# Patient Record
Sex: Female | Born: 1965 | Race: White | Hispanic: No | Marital: Married | Smoking: Never smoker
Health system: Southern US, Community
[De-identification: ages and names within clinical notes are randomized; demographics above are authoritative.]

## PROBLEM LIST (undated history)

## (undated) DIAGNOSIS — O903 Peripartum cardiomyopathy: Secondary | ICD-10-CM

## (undated) DIAGNOSIS — T7840XA Allergy, unspecified, initial encounter: Secondary | ICD-10-CM

## (undated) DIAGNOSIS — K8050 Calculus of bile duct without cholangitis or cholecystitis without obstruction: Secondary | ICD-10-CM

## (undated) DIAGNOSIS — L708 Other acne: Secondary | ICD-10-CM

## (undated) DIAGNOSIS — E162 Hypoglycemia, unspecified: Secondary | ICD-10-CM

## (undated) DIAGNOSIS — I38 Endocarditis, valve unspecified: Secondary | ICD-10-CM

## (undated) DIAGNOSIS — J40 Bronchitis, not specified as acute or chronic: Secondary | ICD-10-CM

## (undated) DIAGNOSIS — E119 Type 2 diabetes mellitus without complications: Secondary | ICD-10-CM

## (undated) DIAGNOSIS — K219 Gastro-esophageal reflux disease without esophagitis: Secondary | ICD-10-CM

## (undated) DIAGNOSIS — J45909 Unspecified asthma, uncomplicated: Secondary | ICD-10-CM

## (undated) DIAGNOSIS — R0789 Other chest pain: Secondary | ICD-10-CM

## (undated) DIAGNOSIS — Z86018 Personal history of other benign neoplasm: Secondary | ICD-10-CM

## (undated) HISTORY — DX: Allergy, unspecified, initial encounter: T78.40XA

## (undated) HISTORY — DX: Calculus of bile duct without cholangitis or cholecystitis without obstruction: K80.50

## (undated) HISTORY — PX: OTHER SURGICAL HISTORY: SHX169

## (undated) HISTORY — DX: Gastro-esophageal reflux disease without esophagitis: K21.9

## (undated) HISTORY — DX: Unspecified asthma, uncomplicated: J45.909

## (undated) HISTORY — DX: Other chest pain: R07.89

## (undated) HISTORY — DX: Hypoglycemia, unspecified: E16.2

## (undated) HISTORY — PX: WISDOM TOOTH EXTRACTION: SHX21

## (undated) HISTORY — DX: Other acne: L70.8

---
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
## 2002-12-04 ENCOUNTER — Inpatient Hospital Stay (HOSPITAL_COMMUNITY): Admission: AD | Admit: 2002-12-04 | Discharge: 2002-12-07 | Payer: Self-pay | Admitting: Obstetrics and Gynecology

## 2002-12-09 ENCOUNTER — Encounter: Payer: Self-pay | Admitting: Cardiovascular Disease

## 2002-12-09 ENCOUNTER — Inpatient Hospital Stay (HOSPITAL_COMMUNITY): Admission: AD | Admit: 2002-12-09 | Discharge: 2002-12-10 | Payer: Self-pay | Admitting: Obstetrics and Gynecology

## 2002-12-09 ENCOUNTER — Encounter: Payer: Self-pay | Admitting: Cardiology

## 2003-01-27 ENCOUNTER — Other Ambulatory Visit: Admission: RE | Admit: 2003-01-27 | Discharge: 2003-01-27 | Payer: Self-pay | Admitting: Obstetrics & Gynecology

## 2004-02-04 ENCOUNTER — Other Ambulatory Visit: Admission: RE | Admit: 2004-02-04 | Discharge: 2004-02-04 | Payer: Self-pay | Admitting: Obstetrics and Gynecology

## 2004-08-19 ENCOUNTER — Ambulatory Visit: Payer: Self-pay | Admitting: Family Medicine

## 2004-10-25 ENCOUNTER — Ambulatory Visit: Payer: Self-pay | Admitting: Family Medicine

## 2005-01-31 ENCOUNTER — Ambulatory Visit: Payer: Self-pay | Admitting: Family Medicine

## 2005-02-08 ENCOUNTER — Other Ambulatory Visit: Admission: RE | Admit: 2005-02-08 | Discharge: 2005-02-08 | Payer: Self-pay | Admitting: Obstetrics and Gynecology

## 2005-03-17 ENCOUNTER — Ambulatory Visit: Payer: Self-pay | Admitting: Family Medicine

## 2005-04-17 ENCOUNTER — Ambulatory Visit: Payer: Self-pay | Admitting: Family Medicine

## 2005-05-08 ENCOUNTER — Ambulatory Visit: Payer: Self-pay | Admitting: Internal Medicine

## 2005-10-23 ENCOUNTER — Ambulatory Visit: Payer: Self-pay | Admitting: Family Medicine

## 2005-10-25 ENCOUNTER — Ambulatory Visit: Payer: Self-pay | Admitting: Family Medicine

## 2005-12-23 HISTORY — PX: SPIROMETRY: SHX456

## 2005-12-26 ENCOUNTER — Ambulatory Visit: Payer: Self-pay | Admitting: Family Medicine

## 2005-12-27 ENCOUNTER — Ambulatory Visit: Payer: Self-pay | Admitting: Allergy and Immunology

## 2006-01-01 ENCOUNTER — Ambulatory Visit: Payer: Self-pay | Admitting: Family Medicine

## 2006-01-04 ENCOUNTER — Ambulatory Visit: Payer: Self-pay | Admitting: Family Medicine

## 2006-01-10 ENCOUNTER — Ambulatory Visit: Payer: Self-pay | Admitting: Family Medicine

## 2006-01-26 ENCOUNTER — Ambulatory Visit: Payer: Self-pay | Admitting: Family Medicine

## 2006-04-26 ENCOUNTER — Ambulatory Visit: Payer: Self-pay | Admitting: Family Medicine

## 2006-10-05 ENCOUNTER — Ambulatory Visit: Payer: Self-pay | Admitting: Family Medicine

## 2007-11-08 DIAGNOSIS — L708 Other acne: Secondary | ICD-10-CM

## 2007-11-08 DIAGNOSIS — J309 Allergic rhinitis, unspecified: Secondary | ICD-10-CM | POA: Insufficient documentation

## 2007-11-08 DIAGNOSIS — J45909 Unspecified asthma, uncomplicated: Secondary | ICD-10-CM | POA: Insufficient documentation

## 2007-11-08 DIAGNOSIS — E162 Hypoglycemia, unspecified: Secondary | ICD-10-CM

## 2007-11-08 DIAGNOSIS — K649 Unspecified hemorrhoids: Secondary | ICD-10-CM | POA: Insufficient documentation

## 2007-11-08 HISTORY — DX: Hypoglycemia, unspecified: E16.2

## 2007-11-08 HISTORY — DX: Other acne: L70.8

## 2007-11-11 ENCOUNTER — Ambulatory Visit: Payer: Self-pay | Admitting: Family Medicine

## 2007-11-11 DIAGNOSIS — L03019 Cellulitis of unspecified finger: Secondary | ICD-10-CM | POA: Insufficient documentation

## 2007-11-12 ENCOUNTER — Telehealth (INDEPENDENT_AMBULATORY_CARE_PROVIDER_SITE_OTHER): Payer: Self-pay | Admitting: *Deleted

## 2008-05-12 ENCOUNTER — Encounter: Payer: Self-pay | Admitting: Cardiovascular Disease

## 2008-05-12 ENCOUNTER — Emergency Department: Payer: Self-pay | Admitting: Emergency Medicine

## 2008-05-14 ENCOUNTER — Emergency Department: Payer: Self-pay | Admitting: Emergency Medicine

## 2008-09-08 ENCOUNTER — Encounter: Admission: RE | Admit: 2008-09-08 | Discharge: 2008-09-08 | Payer: Self-pay | Admitting: Obstetrics and Gynecology

## 2009-05-12 ENCOUNTER — Encounter: Payer: Self-pay | Admitting: Cardiovascular Disease

## 2009-09-08 ENCOUNTER — Telehealth (INDEPENDENT_AMBULATORY_CARE_PROVIDER_SITE_OTHER): Payer: Self-pay | Admitting: *Deleted

## 2009-09-14 ENCOUNTER — Ambulatory Visit: Payer: Self-pay | Admitting: Cardiovascular Disease

## 2009-09-14 DIAGNOSIS — R0789 Other chest pain: Secondary | ICD-10-CM

## 2009-09-14 DIAGNOSIS — E785 Hyperlipidemia, unspecified: Secondary | ICD-10-CM | POA: Insufficient documentation

## 2009-09-14 DIAGNOSIS — R0602 Shortness of breath: Secondary | ICD-10-CM | POA: Insufficient documentation

## 2009-09-14 HISTORY — DX: Other chest pain: R07.89

## 2010-02-22 NOTE — Assessment & Plan Note (Signed)
Summary: np6/.hx of cardiomyopathy   Visit Type:  Initial Consult Primary Lashina Milles:  Grace [name].  CC:  c/o having chest pressure & shortness of breath.  She tried to come off xanax and paxil but started to have symptoms of chest pressure and shortness of breath.   The visit today is more of a precautionary for her because after her first pregnancy she was told had cardiomyopathy..  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  She reports that she came off all of her medications shortly after diuresis in 2004 and has felt well since. She did have an episode of shortness of breath in April 2010 and went to the emergency room where she had a normal appearing CT scan of the chest, normal x-ray, normal labs. She was started on Paxil and Xanax and it was felt her symptoms were due to anxiety/panic.  She reports that she has gained significant weight over the past several years. She does not exercise very much as she travels in her car most of the day and eat junk food. She has tried to wean herself off Paxil and Xanax and she has felt a pressure in her chest and some shortness of breath. She is otherwise asymptomatic with exertion. She denies any significant lower extremity edema.  EKG shows normal sinus rhythm with a rate of 80 beats per minute, no significant ST or T wave changes.  She reports a total cholesterol of 200.  Some family history of coronary artery disease with her grandmother, father died early from lung cancer, no heart disease with her mother  Preventive Screening-Counseling & Management  Caffeine-Diet-Exercise     Does Patient Exercise: no   Drug Use:  no.    Current Medications (verified): 1)  Albuterol 90 Mcg/act Aers (Albuterol) .... As Needed 2)  Levlen (28) 0.15-30 Mg-Mcg  Tabs (Levonorgestrel-Ethinyl Estrad) .... Once Daily 3)  Nasonex 50 Mcg/act  Susp (Mometasone Furoate) .... Once Daily 4)  Asmanex 120 Metered Doses 220 Mcg/inh  Aepb (Mometasone Furoate) .... Two Times A Day 5)  Clarinex 5 Mg  Tabs (Desloratadine) .... As Needed 6)  Multivitamins   Tabs (Multiple Vitamin) .... Once Daily 7)  Calcium 500/d 500-400 Mg-Unit  Chew (Calcium-Vitamin D) .... Once Daily 8)  Fish Oil   Oil (Fish Oil) .... (Sometimes) 9)  Vitamin D 400 Unit  Tabs (Cholecalciferol) .... Once Daily 10)  Paxil 10 Mg Tabs (Paroxetine Hcl) .... One Tablet Once Daily 11)  Xanax 0.25 Mg Tabs (Alprazolam) .... 1/2 Three Times A Day  Allergies (verified): 1)  ! Penicillin 2)  ! * Antihistamines 3)  ! Keflex (Cephalexin)  Past History:  Past Medical History: Last updated: 11/08/2007 Allergic rhinitis Asthma  Past Surgical History: Last updated: 11/08/2007 Caesarean section HTN/ pulmonary edema with childbirth, had cardiac w/ up Spirometry ( at allergist's)- normal (12/2005)  Family History: Last updated: 11/08/2007 Father: chol, HTN Mother: chol, HTN Siblings:   Social History: Last updated: 09/14/2009 Marital Status: Married Children: 1 Occupation:  non smoker Alcohol Use - yes--occas. beer Drug Use - no Regular Exercise - no  Risk Factors: Smoking Status: never (11/08/2007)  Social History: Marital Status: Married Children: 1 Occupation:  non smoker  Alcohol Use - yes--occas. beer Drug Use - no Regular Exercise - no Drug Use:  no Does Patient Exercise:  no  Review of Systems       The patient complains of dyspnea on exertion.  The patient denies fever, weight loss, weight gain, vision loss, decreased hearing, hoarseness, chest pain, syncope, peripheral edema, prolonged cough, abdominal pain, incontinence, muscle  weakness, depression, and enlarged lymph nodes.         SOB and chest pressure at rest  Vital Signs:  Patient profile:   45 year old female Height:      61 inches Weight:      193 pounds BMI:     36.60 Pulse rate:   80 / minute BP sitting:   131 / 86  (left arm) Cuff size:   large  Vitals Entered By: Bishop Dublin, CMA (September 14, 2009 3:03 PM)  Physical Exam  General:  Well developed, well nourished, in no acute distress. Obese Head:  normocephalic and atraumatic Neck:  Neck supple, no JVD. No masses, thyromegaly or abnormal cervical nodes. Lungs:  Clear bilaterally to auscultation and percussion. Heart:  Non-displaced PMI, chest non-tender; regular rate and rhythm, S1, S2 without murmurs, rubs or gallops. Carotid upstroke normal, no bruit.  Pedals normal pulses. No edema, no varicosities. Abdomen:   abdomen soft and non-tender without masses Msk:  Back normal, normal gait. Muscle strength and tone normal. Pulses:  pulses normal in all 4 extremities Extremities:  No clubbing or cyanosis. Neurologic:  Alert and oriented x 3. Skin:  Intact without lesions or rashes. Psych:  Normal affect.   Impression & Recommendations:  Problem # 1:  OTHER CHEST PAIN (ICD-786.59) etiology of her chest pain is likely noncardiac. It comes on at rest and not associated with exertion. She has noticed it more as she has been weaning herself from Paxil and Xanax. She does eat a significant amount of food. Although she denies any significant lower extremity edema, and concerned about diastolic dysfunction given her size. We have given her a prescription for Lasix which she could take on a p.r.n. basis for shortness of breath. We also mentioned that possibly she could try low-dose beta blockers for her anxiety as a way to transition off her Paxil and Xanax. She would like to think about this.  Orders: EKG w/ Interpretation (93000)  Problem # 2:  HYPERLIPIDEMIA-MIXED (ICD-272.4) I have encouraged  her to increase her exercise, watch her diet in an effort to lose weight. This will help with her elevated cholesterol.  Problem # 3:  DYSPNEA (ICD-786.05) etiology of her dyspnea at rest is uncertain. We will try Lasix p.r.n. for significant symptoms. I asked her to cut back on her salt/sodium and junk food. We'll need to work on her weight, increase her exercise.  Appended Document: np6/.hx of cardiomyopathy echocardiogram from 2004 shows borderline to mildly reduced LV systolic function, mild to moderately dilated left atrium, mild mitral valve regurgitation, pleural effusion there was moderate size, mild tricuspid valve regurgitation, dilated IVC, mild.  Appended Document: np6/.hx of cardiomyopathy    Clinical Lists Changes  Medications: Added new medication of FUROSEMIDE 20 MG TABS (FUROSEMIDE) Take 1 tablet by mouth two times a day as needed for shortness of breath - Signed Rx of FUROSEMIDE 20 MG TABS (FUROSEMIDE) Take 1 tablet by mouth two times a day as needed for shortness of breath;  #45 x 2;  Signed;  Entered by: Benedict Needy, RN;  Authorized by: Dossie Arbour  MD;  Method used: Electronically to [id number] [address], [place], [place]  [address], Ph: [phone], Fax: [phone]    Prescriptions: FUROSEMIDE 20 MG TABS (FUROSEMIDE) Take 1 tablet by mouth two times a day as needed for shortness of breath  #45 x 2   Entered by:   Benedict Needy, RN   Authorized by:   Dossie Arbour MD   Signed by:   Benedict Needy, RN on 09/15/2009   Method used:   Electronically to        [id number] (retail)       [address]       [place], [place]  [address]       Ph: [phone]       Fax: [phone]   RxID:   [id number]

## 2010-02-22 NOTE — Letter (Signed)
Summary: PHI  PHI   Imported By: Harlon Flor 09/15/2009 16:39:48  _____________________________________________________________________  External Attachment:    Type:   Image     Comment:   External Document

## 2010-02-22 NOTE — Progress Notes (Signed)
  Phone Note Call from Patient   Caller: Pt Initial call taken by: KM    Pt called this a.m. asking for Records to be faxed/Recieved to/From [facility], Faxed her 2 ROI she completed faxed back to me,I sent over the Last 2 Echo's done here,also sent ROI over to Dr.Sam Morriyati's office @ [facility]  for all records to be sent over here to make a New Pt Appt. Grace [name]  September 08, 2009 8:55 AM  Appended Document:  Records recieved from [facility] gave to [place], Pt needs to make a NP appt

## 2010-06-10 NOTE — Discharge Summary (Signed)
NAME:  Grace [name], Grace [name]                        ACCOUNT NO.:  [id number]   MEDICAL RECORD NO.:  [id number]                   PATIENT TYPE:  INP   LOCATION:  9313                                 FACILITY:  [facility]   PHYSICIAN:  Freddy Finner, M.D.                DATE OF BIRTH:  Nov 24, 1965   DATE OF ADMISSION:  12/09/2002  DATE OF DISCHARGE:  12/10/2002                                 DISCHARGE SUMMARY   DISCHARGE DIAGNOSES:  Postpartum cardiomegaly and pulmonary edema secondary  to diastolic dysfunction, borderline hypertension, and fluid retention  secondary to pregnancy or alternatively postpartum cardiomyopathy. These  diagnoses addressed by Dr. Dietrich Pates with the [facility].   DISPOSITION:  The patient was in satisfactory improved condition at the time  of her discharge after a rather traumatic diuresis with IV Furosemide. She  was instructed to followup with Dr. Dietrich Pates in his [facility] office on  December 24, 2002 with another appointment on January 21, 2003.   DISCHARGE MEDICATIONS:  1. Lasix 20 mg a day.  2. KCL 10 meq a day.  3. Altace 10 mg a day.   HISTORY OF PRESENT ILLNESS:  Details of the present illness are recorded in  the admission note by Dr. Henderson Cloud and in the consultation note of Dr.  Dietrich Pates. Briefly, the patient presented on the day after her discharge from  cesarean delivery of December 05, 2002. Her physical findings on admission  did note O2 saturation of 99% on room air. Blood pressure 140/80. Chest was  remarkable for decreased breath sounds at the right base. She had 3+  bilateral pitting edema.   LABORATORY DATA:  On admission, she had a right middle lobe infiltrate,  cardiomegaly. A spiral CT showed congestive failure. No evidence of  pulmonary embolus.   Arterial blood gases on admission pH of 7.47, pO2 98.8.   HOSPITAL COURSE:  The patient was admitted by Dr. Harold Hedge and  subsequently was evaluated in consultation by Dr. Dietrich Pates. As  noted above,  the differential included cardiomegaly secondary to cardiomyopathy in the  postpartum period or diastolic dysfunction as noted above. Her hospital  course was remarkable for a rather traumatic immediate improvement with IV  Furosemide. Her response was so good that by the day after admission, her  condition was satisfactory for discharge and she was discharged with  disposition as noted above.                                               Freddy Finner, M.D.    WRN/MEDQ  D:  01/21/2003  T:  01/21/2003  Job:  [id number]   cc:   [place]omar Bing, M.D.

## 2010-06-10 NOTE — Discharge Summary (Signed)
NAME:  Grace [name], Grace [name]                          ACCOUNT NO.:  [id number]   MEDICAL RECORD NO.:  [id number]                   PATIENT TYPE:   LOCATION:                                       FACILITY:   PHYSICIAN:  Michelle L. Vincente Poli, M.D.            DATE OF BIRTH:   DATE OF ADMISSION:  12/04/2002  DATE OF DISCHARGE:  12/07/2002                                 DISCHARGE SUMMARY   ADMITTING DIAGNOSES:  1. Intrauterine pregnancy at 39 weeks estimated gestational age.  2. Mild pregnancy-induced hypertension.   DISCHARGE DIAGNOSES:  1. Status post low transverse cesarean section secondary to arrest of labor     at 8 cm.  2. Viable female infant.   PROCEDURE:  Primary low transverse cesarean section.   REASON FOR ADMISSION:  Please see written H&P.   HOSPITAL COURSE:  The patient was a 45 year old primigravida at 38-and-a-  half weeks estimated gestational age that was seen in the office the day of  admission with blood pressure noted to be 150/90 with a trace of  proteinuria.  The patient was also noted to have bilateral peripheral edema.  The patient was sent over to [facility] for [facility] labs and  nonstress test.  Decision was made for admission with artificial rupture of  membranes induction of labor.  Fetal heart tones were reactive, 142.  Cervix  was noted to be 3 cm dilated with vertex presentation.  Artificial rupture  of membranes was performed revealing clear fluid.  PIH labs were within  normal limits with liver function tests normal, uric acid 3.4.  On the  following morning fetal heart tones were reactive.  The patient was noted to  have uterine contractions every two minutes.  She did complain of some low  back pain.  The cervix was noted to be 7-8 cm dilated, completely effaced,  and vertex.  Intrauterine pressure catheter was placed for adequate  monitoring of labor.  Epidural was also given for the patient's comfort.  Pitocin was administered per  protocol.  Later that day the patient had noted  to be in good labor pattern; however, cervix was unchanged.  Decision was  made to proceed with a low transverse cesarean section.  The patient was  then transferred to the operating room where epidural was dosed to an  adequate surgical level.  A low transverse incision was made with the  delivery of a viable female infant weighing 8 pounds 6 ounces, Apgars of 8  at one minute and 9 at five minutes.  The patient did tolerate the procedure  well and was taken to the recovery room in stable condition.  On  postoperative day #1 vital signs were stable.  She had good return of bowel  function.  Fundus was firm and nontender.  Abdominal dressing was clean,  dry, and intact.  Labs revealed hemoglobin of 8.0; platelet count of  209,000; wbc count  of 13.3.  On postoperative day #2 the patient was doing  well; however, she did desire early discharge.  Vital signs were stable.  Abdomen was soft and nontender.  Incision was clean, dry, and intact.  Staples were removed and the patient was discharged home.   CONDITION ON DISCHARGE:  Good.   DIET:  Regular as tolerated..   ACTIVITY:  No heavy lifting, no driving x2 weeks, no vaginal entry.   FOLLOW-UP:  The patient is to follow up in the office in one week for an  incision check.  She is to call for temperature greater than 100 degrees,  persistent nausea or vomiting, heavy vaginal bleeding, and/or redness or  drainage from the incisional site.   DISCHARGE MEDICATIONS:  1. Tylox #30 one p.o. q.4-6h. p.r.n.  2. Ibuprofen 600 mg q.6h.  3. Prenatal vitamins one p.o. daily.  4. Colace one p.o. daily p.r.n.     Julio Sicks, N.P.                        Stann Mainland. Vincente Poli, M.D.    CC/MEDQ  D:  01/02/2003  T:  01/03/2003  Job:  [id number]

## 2010-06-10 NOTE — Consult Note (Signed)
NAME:  Grace [name], Grace [name]                        ACCOUNT NO.:  [id number]   MEDICAL RECORD NO.:  [id number]                   PATIENT TYPE:  INP   LOCATION:  9313                                 [facility]   PHYSICIAN:  [place]iddings Bing, M.D.               DATE OF BIRTH:  1965/11/25   DATE OF CONSULTATION:  12/09/2002  DATE OF DISCHARGE:                                   CONSULTATION   REFERRING PHYSICIAN:  Guy Sandifer. Henderson Cloud, M.D.   PRIMARY CARE PHYSICIAN:  None.   HISTORY OF PRESENT ILLNESS:  A 45 year old woman admitted to [facility] a few days after delivery by cesarean section with pulmonary edema.  Grace [name] has no prior cardiac history.  She has never previously been seen  by a cardiologist nor required any cardiac testing.  She has enjoyed normal  development and normal exercise tolerance even through the final weeks of  her pregnancy.  She did have some mild hypertension during pregnancy and  some mild proteinuria.  She delivered a healthy full-term infant four days  ago.  She developed after going home increase in exertional dyspnea, pedal  edema, and dizziness.  She returned to the hospital yesterday and was found  to have congestive heart failure by chest x-ray and by CT scan which ruled  out pulmonary embolism.  She had orthopnea last night, but is markedly  improved after a minimal dose of furosemide and a subsequent diuresis.  She  is not nursing her child.   PAST MEDICAL HISTORY:  Otherwise unremarkable.   ALLERGIES:  The patient reports an allergy to PENICILLIN.   MEDICATIONS:  Her only recent medication is Pepcid.   SOCIAL HISTORY:  Lives in [place].  This is her first pregnancy.  Has not  used tobacco products nor significant alcohol.  Employment is office work.   FAMILY HISTORY:  Parents are in their 17s and alive and well.   REVIEW OF SYSTEMS:  Some recent weight gain other than her pregnancy, a few  episodes of nocturnal diaphoresis, occasional  headaches and blurred vision.  All other systems negative.   PHYSICAL EXAMINATION:  GENERAL:  Mildly overweight, very pleasant, well-  appearing woman.  VITAL SIGNS:  Temperature 99.5, heart rate 70 and regular, respirations 16,  blood pressure 160/90.  HEENT:  Anicteric sclerae.  NECK:  Mild jugular venous distention.  No carotid bruits.  LUNGS:  Minimal bibasilar rales.  CARDIAC:  Normal first and second heart sounds.  Fourth heart sound had  modest early systolic murmur.  ABDOMEN:  Soft and nontender.  Well healing surgical incision.  EXTREMITIES:  Trace edema.  ENDOCRINE:  No thyromegaly.  HEMATOPOIETIC:  No adenopathy.   LABORATORIES:  EKG:  Normal sinus rhythm, anterior T-wave inversions.   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  An echocardiogram is pending.  We will also obtain a  TSH level which probably was done with her pregnancy and a BNP level.  She  will likely require treatment with ongoing diuretics, a beta blocker, and an  ACE inhibitor.  The prognosis and implications of her disease were discussed  with Grace [name].  She is appropriately concerned.  She was instructed in a  salt restricted diet.  If she continues to improve at this rate she could  likely be discharged in the morning for further outpatient cardiology follow-  up.                                               [place]lawn Bing, M.D.    RR/MEDQ  D:  12/09/2002  T:  12/09/2002  Job:  [id number]

## 2010-06-10 NOTE — Op Note (Signed)
NAME:  [name], Grace Kathie Rhodes                        ACCOUNT NO.:  [id number]   MEDICAL RECORD NO.:  [id number]                   PATIENT TYPE:  INP   LOCATION:  9163                                 [facility]   PHYSICIAN:  Michelle L. Vincente Poli, M.D.            DATE OF BIRTH:  27-Nov-1965   DATE OF PROCEDURE:  12/05/2002  DATE OF DISCHARGE:                                 OPERATIVE REPORT   PREOPERATIVE DIAGNOSES:  1. Intrauterine pregnancy at 38-1/2 weeks.  2. Mild pregnancy-induced hypertension.  3. Arrest of dilation at 8 cm.   POSTOPERATIVE DIAGNOSES:  1. Intrauterine pregnancy at 38-1/2 weeks.  2. Mild pregnancy-induced hypertension.  3. Arrest of dilation at 8 cm.   PROCEDURE:  Primary low transverse cesarean section.   SURGEON:  Michelle L. Vincente Poli, M.D.   ANESTHESIA:  Epidural.   ESTIMATED BLOOD LOSS:  500 mL.   FINDINGS:  A female infant in cephalic presentation in OP position.  Apgars  8 at one minute and 9 at five minutes, weighing 8 pounds 6 ounces.   DESCRIPTION OF PROCEDURE:  The patient was taken to the operating room, her  epidural was dosed.  She was then prepped and draped in the usual sterile  fashion.  A Foley catheter had already been placed in labor and delivery  while she was in labor.  A low transverse incision was made and carried down  to the fascia.  The fascia was scored in the midline.  It was noted she had  significant subcutaneous edema, probably due to the mild PIH that she has.  A Pfannenstiel incision was then developed.  The rectus muscles were  separated in the midline.  The peritoneum was entered sharply.  The  peritoneal incision was extended superiorly and then inferiorly with careful  attention to avoid the bladder.  The bladder blade was inserted and the  lower uterine segment was identified, and the bladder flap was created  sharply and then digitally, and the bladder blade was then readjusted.  A  low transverse incision was made in  the uterus and the uterus was entered  using a hemostat.  The amniotic fluid was noted to be clear.  The baby was  in OP position, was a female, and was delivered without any difficulty.  Apgars were 8 at one minute and 9 at five minutes, and the baby weighed 8  pounds 6 ounces.  The cord was clamped and cut and the baby was handed to  the waiting pediatricians.  The placenta was manually removed and noted to  be normal, and the uterus was exteriorized and cleared of all clots and  debris.  The uterine was closed in two layers using 0 chromic in continuous  running locked stitch.  The uterus was returned to the abdomen, reinspected,  and noted to be entirely hemostatic.  The peritoneum was closed using 0  Vicryl in a continuous running  stitch.  The rectus muscles were  reapproximated using the same 0 Vicryl.  The fascia was closed using 0  Vicryl in continuous running stitch, starting at each corner  and meeting in the midline.  After inspection of the subcutaneous layers,  the skin was closed with staples.  All sponge, lap, and instrument counts  were correct x2.  The patient tolerated the procedure well and went to the  recovery room in stable condition.                                               Michelle L. Vincente Poli, M.D.    Florestine Avers  D:  12/05/2002  T:  12/05/2002  Job:  [id number]

## 2010-08-17 ENCOUNTER — Encounter: Payer: Self-pay | Admitting: Cardiovascular Disease

## 2010-11-01 ENCOUNTER — Ambulatory Visit: Payer: Self-pay | Admitting: Cardiovascular Disease

## 2010-11-03 ENCOUNTER — Encounter: Payer: Self-pay | Admitting: Cardiovascular Disease

## 2010-11-07 ENCOUNTER — Ambulatory Visit: Payer: Self-pay | Admitting: Cardiovascular Disease

## 2010-11-17 ENCOUNTER — Encounter: Payer: Self-pay | Admitting: Cardiovascular Disease

## 2011-03-09 ENCOUNTER — Emergency Department: Payer: Self-pay | Admitting: Emergency Medicine

## 2011-03-09 LAB — CBC WITH DIFFERENTIAL/PLATELET
Basophil #: 0.1 10*3/uL (ref 0.0–0.1)
Basophil %: 1 %
Eosinophil #: 0.2 10*3/uL (ref 0.0–0.7)
Eosinophil %: 1.8 %
HCT: 35.7 % (ref 35.0–47.0)
HGB: 12.1 g/dL (ref 12.0–16.0)
Lymphocyte #: 3.7 10*3/uL — ABNORMAL HIGH (ref 1.0–3.6)
Lymphocyte %: 31.9 %
MCH: 29.8 pg (ref 26.0–34.0)
MCHC: 33.9 g/dL (ref 32.0–36.0)
MCV: 88 fL (ref 80–100)
Monocyte #: 0.7 10*3/uL (ref 0.0–0.7)
Monocyte %: 6 %
Neutrophil #: 6.9 10*3/uL — ABNORMAL HIGH (ref 1.4–6.5)
Neutrophil %: 59.3 %
Platelet: 276 10*3/uL (ref 150–440)
RBC: 4.06 10*6/uL (ref 3.80–5.20)
RDW: 16 % — ABNORMAL HIGH (ref 11.5–14.5)
WBC: 11.6 10*3/uL — ABNORMAL HIGH (ref 3.6–11.0)

## 2011-03-09 LAB — COMPREHENSIVE METABOLIC PANEL
Albumin: 3.6 g/dL (ref 3.4–5.0)
Alkaline Phosphatase: 92 U/L (ref 50–136)
Anion Gap: 11 (ref 7–16)
BUN: 10 mg/dL (ref 7–18)
Bilirubin,Total: 0.3 mg/dL (ref 0.2–1.0)
Calcium, Total: 8.3 mg/dL — ABNORMAL LOW (ref 8.5–10.1)
Chloride: 108 mmol/L — ABNORMAL HIGH (ref 98–107)
Co2: 22 mmol/L (ref 21–32)
Creatinine: 0.83 mg/dL (ref 0.60–1.30)
EGFR (African American): >60
EGFR (Non-African Amer.): >60
Glucose: 86 mg/dL (ref 65–99)
Osmolality: 280 (ref 275–301)
Potassium: 3.7 mmol/L (ref 3.5–5.1)
SGOT(AST): 17 U/L (ref 15–37)
SGPT (ALT): 18 U/L
Sodium: 141 mmol/L (ref 136–145)
Total Protein: 7.4 g/dL (ref 6.4–8.2)

## 2011-03-09 LAB — TSH: Thyroid Stimulating Horm: 2.2 u[IU]/mL

## 2011-03-09 LAB — TROPONIN I: Troponin-I: <0.02 ng/mL

## 2011-03-10 LAB — CK TOTAL AND CKMB (NOT AT ARMC)
CK, Total: 57 U/L (ref 21–215)
CK-MB: <0.5 ng/mL — ABNORMAL LOW (ref 0.5–3.6)

## 2011-03-10 LAB — PRO B NATRIURETIC PEPTIDE: B-Type Natriuretic Peptide: 47 pg/mL (ref 0–125)

## 2011-03-10 LAB — TROPONIN I: Troponin-I: <0.02 ng/mL

## 2011-03-17 ENCOUNTER — Ambulatory Visit: Payer: Self-pay | Admitting: Endocrinology

## 2011-03-30 ENCOUNTER — Ambulatory Visit: Payer: Self-pay | Admitting: Cardiovascular Disease

## 2011-12-25 ENCOUNTER — Institutional Professional Consult (permissible substitution): Payer: Self-pay | Admitting: Pulmonary Disease

## 2012-03-12 ENCOUNTER — Ambulatory Visit (INDEPENDENT_AMBULATORY_CARE_PROVIDER_SITE_OTHER): Payer: BC Managed Care – PPO | Admitting: Pulmonary Disease

## 2012-03-12 ENCOUNTER — Ambulatory Visit (INDEPENDENT_AMBULATORY_CARE_PROVIDER_SITE_OTHER)
Admission: RE | Admit: 2012-03-12 | Discharge: 2012-03-12 | Disposition: A | Discharge status: Home / Self Care | Payer: BC Managed Care – PPO | Source: Ambulatory Visit | Attending: Pulmonary Disease | Admitting: Pulmonary Disease

## 2012-03-12 ENCOUNTER — Encounter: Payer: Self-pay | Admitting: Pulmonary Disease

## 2012-03-12 VITALS — BP 108/66 | HR 73 | Ht 61.0 in | Wt 210.0 lb

## 2012-03-12 DIAGNOSIS — J45909 Unspecified asthma, uncomplicated: Secondary | ICD-10-CM

## 2012-03-12 DIAGNOSIS — R0602 Shortness of breath: Secondary | ICD-10-CM

## 2012-03-12 NOTE — Assessment & Plan Note (Signed)
I seriously question the diagnosis of asthma given the fact that Grace [name] does not have any environmental triggers for her shortness of breath. Because she receives some relief from albuterol and has a history of allergies is reasonable to consider this in the differential diagnosis. However, her symptoms are textbook descriptions of vocal cord dysfunction. She herself believes that anxiety is the primary cause of her shortness of breath and vocal cord dysfunction is strongly tied to this diagnosis.  In order to make this diagnosis she really needs to have a methacholine challenge test with concomitant laryngoscopy. Our best approach in the [place] area is to start with a methacholine challenge test. If that is negative then asthma will be ruled out and we can focus our treatment on that point on vocal cord dysfunction. If that test is equivocal then I will refer her to [facility] for a methacholine challenge test with concomitant laryngoscopy.  Plan: -Methacholine challenge -Continue Asmanex and albuterol for now -If methacholine challenge is negative then refer for behavioral therapy for vocal cord dysfunction (ideally we'll try to find somebody in [place] near where she works) -If methacholine challenge test is positive then she may need to have a methacholine challenge with concomitant laryngoscopy.

## 2012-03-12 NOTE — Progress Notes (Signed)
Subjective:    Patient ID: Grace [name], female    DOB: 09-Dec-1965, 47 y.o.   MRN: [id number]  HPI This is a very pleasant 47 year old female with anxiety and allergic rhinitis who comes to our clinic today for evaluation of possible asthma and shortness of breath. She states that as a child she had hayfever but no lung problems. During adulthood her allergies have been significant leading to frequent sinus infections. About once a year in the last 2 years she's had sinus and upper respiratory infections and been treated like bronchitis with antibiotics and Mucinex. She has been diagnosed with pneumonia twice in the last 5 years. 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 She was hospitalized briefly for this and her heart failure has resolved since. In 2010 she develop shortness of breath and she thought that this was a recurrence of her heart failure. She was evaluated in emergency room and was told it was anxiety. In the last 2 years she's had intermittent episodes of shortness of breath requiring at least 2 stress tests which have been negative and multiple EKGs. She tells me that she will get short of breath when she is at rest. She states that she will feel the sensation of a lump in her throat and that she cannot get enough air into her lungs. It is frequently associated with anxiety and chest tightness. Sometimes it is associated with heartburn. It has happened twice on Valentine's Day for the last 2 years. She was evaluated in the emergency room on Valentine's Day in 2013 and was told it was anxiety and discharged home. It occurred again on Valentine's Day 2014 and she managed to stay at home and the symptoms subsided on her own. She also had shortness of breath when her daughter started school. She stated that the shortness of breath increases to the point to where she is gasping for air and frankly  feels awful. After rest she will calm down and the shortness of breath goes away.  These episodes do not occur in response to any environmental triggers. She has 2 cats and a dog in the house and states that being around them do not exacerbate the symptoms. Smoker, dust, pollen, fumes do not cause the symptoms. Indigestion and reflux will make the episodes worse. Albuterol sometimes helps her with this. She has been on Asmanex for the last several months and she says that this does not help. This was changed to ciclesonide at some point by an allergist who diagnosed her with vocal cord dysfunction stating that the small particles would likely be more effectively delivered into her lungs given her vocal cord dysfunction. She feels that the shortness of breath is do to anxiety and states that Paxil and Xanax have helped significantly with both the frequency and severity of these episodes. She has also been evaluated by Dr. Bud Face with ear nose and throat who thinks that she may have some component of reflux which is exacerbating her symptoms. She has multiple allergies to grases, pollens, tress, dust, mold, cats, and dogs. She has been unable to tolerate Zyrtec, Claritin, and Singulair for various reasons including itching and "breaking out". She was on allergy shots in 2012 for 6 months, had to quit do to her work schedule as she works in [place] and lives near [place].    Past Medical History  Diagnosis Date  . Allergic  rhinitis   . Asthma      Family History  Problem Relation Age of Onset  . Hyperlipidemia Mother   . Hypertension Mother   . Hypertension Father   . Hyperlipidemia Father      History   Social History  . Marital Status: Married    Spouse Name: N/A    Number of Children: N/A  . Years of Education: N/A   Occupational History  . Not on file.   Social History Main Topics  . Smoking status: Never Smoker   . Smokeless tobacco: Not on file  . Alcohol  Use: Yes     Comment: occas  . Drug Use: No  . Sexually Active:    Other Topics Concern  . Not on file   Social History Narrative  . No narrative on file     Allergies  Allergen Reactions  . Cephalexin     REACTION: (hives)  . Penicillins     REACTION: (child)  . Zyrtec (Cetirizine)      Outpatient Prescriptions Prior to Visit  Medication Sig Dispense Refill  . albuterol (PROVENTIL,VENTOLIN) 90 MCG/ACT inhaler Inhale into the lungs as needed.        . mometasone (NASONEX) 50 MCG/ACT nasal spray Place 2 sprays into the nose daily.        . Multiple Vitamins-Minerals (MULTIVITAL) tablet Take 1 tablet by mouth daily.        Marland Kitchen PARoxetine (PAXIL) 10 MG tablet Take 10 mg by mouth daily.        . vitamin D, CHOLECALCIFEROL, 400 UNITS tablet Take 400 Units by mouth daily.        . mometasone (ASMANEX 120 METERED DOSES) 220 MCG/INH inhaler Inhale 2 puffs into the lungs 2 (two) times daily.        Marland Kitchen ALPRAZolam (XANAX) 0.25 MG tablet Take 0.25 mg by mouth 3 (three) times daily. Take 1/2 tab       . Calcium Carb-Cholecalciferol (CALCIUM 500 +D) 500-400 MG-UNIT TABS Take by mouth daily.        Marland Kitchen desloratadine (CLARINEX) 5 MG tablet Take 5 mg by mouth daily.        . furosemide (LASIX) 20 MG tablet Take 20 mg by mouth 2 (two) times daily. As needed for shortness of breath       . levonorgestrel-ethinyl estradiol (NORDETTE) 0.15-30 MG-MCG per tablet Take 1 tablet by mouth daily.        . Omega-3 Fatty Acids (FISH OIL) 1000 MG CAPS Take by mouth. sometimes        No facility-administered medications prior to visit.      Review of Systems  Constitutional: Negative for fever and unexpected weight change.  HENT: Negative for ear pain, nosebleeds, congestion, sore throat, rhinorrhea, sneezing, trouble swallowing, dental problem, postnasal drip and sinus pressure.   Eyes: Negative for redness and itching.  Respiratory: Positive for chest tightness and shortness of breath. Negative for cough  and wheezing.   Cardiovascular: Negative for palpitations and leg swelling.  Gastrointestinal: Negative for nausea and vomiting.  Genitourinary: Negative for dysuria.  Musculoskeletal: Negative for joint swelling.  Skin: Negative for rash.  Neurological: Negative for headaches.  Hematological: Does not bruise/bleed easily.  Psychiatric/Behavioral: Negative for dysphoric mood. The patient is not nervous/anxious.        Objective:   Physical Exam  Filed Vitals:   03/12/12 1506  BP: 108/66  Pulse: 73  Height: 5\' 1"  (1.549 m)  Weight: 95.255 kg (  210 lb)  SpO2: 99%  Room Air  Gen: well appearing, no acute distress HEENT: NCAT, PERRL, EOMi, OP clear, neck supple without masses PULM: CTA B CV: RRR, no mgr, no JVD AB: BS+, soft, nontender, no hsm Ext: warm, no edema, no clubbing, no cyanosis Derm: no rash or skin breakdown Neuro: A&Ox4, CN II-XII intact, strength 5/5 in all 4 extremities      Assessment & Plan:   ASTHMA I seriously question the diagnosis of asthma given the fact that Ms. Knope does not have any environmental triggers for her shortness of breath. Because she receives some relief from albuterol and has a history of allergies is reasonable to consider this in the differential diagnosis. However, her symptoms are textbook descriptions of vocal cord dysfunction. She herself believes that anxiety is the primary cause of her shortness of breath and vocal cord dysfunction is strongly tied to this diagnosis.  In order to make this diagnosis she really needs to have a methacholine challenge test with concomitant laryngoscopy. Our best approach in the [place] area is to start with a methacholine challenge test. If that is negative then asthma will be ruled out and we can focus our treatment on that point on vocal cord dysfunction. If that test is equivocal then I will refer her to [facility] for a methacholine challenge test with concomitant laryngoscopy.  Plan: -Methacholine  challenge -Continue Asmanex and albuterol for now -If methacholine challenge is negative then refer for behavioral therapy for vocal cord dysfunction (ideally we'll try to find somebody in [place] near where she works) -If methacholine challenge test is positive then she may need to have a methacholine challenge with concomitant laryngoscopy.   Updated Medication List Outpatient Encounter Prescriptions as of 03/12/2012  Medication Sig Dispense Refill  . albuterol (PROVENTIL,VENTOLIN) 90 MCG/ACT inhaler Inhale into the lungs as needed.        . ciclesonide (ALVESCO) 160 MCG/ACT inhaler Inhale 1 puff into the lungs 2 (two) times daily.      Marland Kitchen dexlansoprazole (DEXILANT) 60 MG capsule Take 60 mg by mouth daily.      . mometasone (NASONEX) 50 MCG/ACT nasal spray Place 2 sprays into the nose daily.        . Multiple Vitamins-Minerals (MULTIVITAL) tablet Take 1 tablet by mouth daily.        Marland Kitchen PARoxetine (PAXIL) 10 MG tablet Take 10 mg by mouth daily.        . vitamin D, CHOLECALCIFEROL, 400 UNITS tablet Take 400 Units by mouth daily.        . Vitamin D, Ergocalciferol, (DRISDOL) 50000 UNITS CAPS Take 50,000 Units by mouth every 7 (seven) days.      . [DISCONTINUED] mometasone (ASMANEX 120 METERED DOSES) 220 MCG/INH inhaler Inhale 2 puffs into the lungs 2 (two) times daily.        . [DISCONTINUED] ALPRAZolam (XANAX) 0.25 MG tablet Take 0.25 mg by mouth 3 (three) times daily. Take 1/2 tab       . [DISCONTINUED] Calcium Carb-Cholecalciferol (CALCIUM 500 +D) 500-400 MG-UNIT TABS Take by mouth daily.        . [DISCONTINUED] desloratadine (CLARINEX) 5 MG tablet Take 5 mg by mouth daily.        . [DISCONTINUED] furosemide (LASIX) 20 MG tablet Take 20 mg by mouth 2 (two) times daily. As needed for shortness of breath       . [DISCONTINUED] levonorgestrel-ethinyl estradiol (NORDETTE) 0.15-30 MG-MCG per tablet Take 1 tablet by mouth daily.        . [  DISCONTINUED] Omega-3 Fatty Acids (FISH OIL) 1000 MG  CAPS Take by mouth. sometimes        No facility-administered encounter medications on file as of 03/12/2012.

## 2012-03-12 NOTE — Patient Instructions (Addendum)
We will send you for a chest x-ray at the [facility] office for shortness of breath  We will send you for a metacholine challenge test at [facility] Keep taking your medications as written We will schedule a 6-8 week follow up visit and will try to get all testing needed done before this.  If not all testing is done prior to that visit then we may change the date.

## 2012-03-15 ENCOUNTER — Telehealth: Payer: Self-pay | Admitting: Pulmonary Disease

## 2012-03-15 NOTE — Telephone Encounter (Signed)
Notes Recorded by Lupita Leash, MD on 03/13/2012 at 9:07 PM L,  Please let her know that her CXR was OK  B --  ATC pt she had bad cell reception and then call was lost. Will await pt call back

## 2012-03-18 NOTE — Telephone Encounter (Signed)
LMTCBx2 on all numbers in pt chart. Carron Curie, CMA

## 2012-03-18 NOTE — Telephone Encounter (Signed)
Pt advised of results. Cheyeanne Roadcap, CMA  

## 2012-03-19 ENCOUNTER — Ambulatory Visit: Payer: Self-pay | Admitting: Pulmonary Disease

## 2012-03-19 LAB — PULMONARY FUNCTION TEST

## 2012-03-27 ENCOUNTER — Encounter: Payer: Self-pay | Admitting: Pulmonary Disease

## 2012-03-28 ENCOUNTER — Telehealth: Payer: Self-pay | Admitting: *Deleted

## 2012-03-28 NOTE — Telephone Encounter (Signed)
Spoke to [place] in cardio-pulmonary @[facility]  she said the methacholine was not scheduled however it was on the order anyway do you want me to schedule it now sorry libby

## 2012-03-28 NOTE — Telephone Encounter (Signed)
Please scheduled the metacholine challenge

## 2012-03-28 NOTE — Telephone Encounter (Signed)
Spoke with [facility] and will forward to them to look into this, thanks

## 2012-03-28 NOTE — Telephone Encounter (Signed)
LMTCB for pt 

## 2012-03-28 NOTE — Telephone Encounter (Signed)
Message copied by Christen Butter on Thu Mar 28, 2012  9:26 AM ------      Message from: Max Fickle B      Created: Wed Mar 27, 2012  7:58 PM       L,            I sent this lady to [facility] for a PFT with metacholine challenge.  All they sent me was a full PFT (which was normal).  Can you try to find out if they did a metacholine challenge and if they did to send me the results.              Also, please let her know that the study was normal.            Thanks,      Kipp Brood ------

## 2012-03-29 NOTE — Telephone Encounter (Signed)
Methacholine callenge test scheduled at [facility] 04/04/12@9 :30am lmtcb with pt Grace [name]

## 2012-04-01 NOTE — Telephone Encounter (Signed)
Methacholine will be rescheduled by pt she can not go this date she was given the # to call to reschedule Grace [name]

## 2012-04-03 ENCOUNTER — Encounter: Payer: Self-pay | Admitting: Pulmonary Disease

## 2012-04-09 ENCOUNTER — Ambulatory Visit: Payer: Self-pay | Admitting: Pulmonary Disease

## 2012-04-09 LAB — PULMONARY FUNCTION TEST

## 2012-04-15 ENCOUNTER — Telehealth: Payer: Self-pay | Admitting: Pulmonary Disease

## 2012-04-15 ENCOUNTER — Encounter: Payer: Self-pay | Admitting: Pulmonary Disease

## 2012-04-15 DIAGNOSIS — R0602 Shortness of breath: Secondary | ICD-10-CM

## 2012-04-15 NOTE — Telephone Encounter (Signed)
I called Ms. Lamarche to let her know that her methacholine challenge testing was negative.  SHE DOES NOT HAVE ASTHMA.    Unfortunately I had to leave a message on both her numbers.  cc'ing PCP Dr. Maxie Better, [facility] Pager: [phone] Cell: [phone] If no response, call [phone]

## 2012-04-15 NOTE — Telephone Encounter (Signed)
Spoke with patient, patient aware of recs per Dr. Kendrick Fries as listed below. Patient plans to keep appt on 05/07/12. Nothing further at this time.

## 2012-04-15 NOTE — Telephone Encounter (Signed)
1) She can stop the Alvesco 2) I don't think she needs the laryngoscopy since the likelihood of vocal cord dysfunction is very high 3) It would be worthwhile for her to keep her April appointment with me to make sure she is OK off the albuterol

## 2012-04-15 NOTE — Telephone Encounter (Signed)
Spoke with patient, states she received VM for BQ. Patient has three questions: 1. Since methacholine challenge negative should she continue on the Alvesco 160 - 1 puff BID? 2.Does he still plan on having other procedure done since test was negative, and if so should it also be done prior to next OV 3. Next OV is on 05/07/12 should she keep this appt or have it rescheduled.  Dr. Kendrick Fries please advise, thank you    OV from 03/12/12: Plan:  -Methacholine challenge  -Continue Asmanex and albuterol for now  -If methacholine challenge is negative then refer for behavioral therapy for vocal cord dysfunction (ideally we'll try to find somebody in [place] near where she works)  -If methacholine challenge test is positive then she may need to have a methacholine challenge with concomitant laryngoscopy.

## 2012-04-16 ENCOUNTER — Encounter: Payer: Self-pay | Admitting: Pulmonary Disease

## 2012-04-29 ENCOUNTER — Encounter: Payer: Self-pay | Admitting: Pulmonary Disease

## 2012-04-30 ENCOUNTER — Ambulatory Visit: Payer: BC Managed Care – PPO | Admitting: Pulmonary Disease

## 2012-05-07 ENCOUNTER — Encounter: Payer: Self-pay | Admitting: Pulmonary Disease

## 2012-05-07 ENCOUNTER — Ambulatory Visit (INDEPENDENT_AMBULATORY_CARE_PROVIDER_SITE_OTHER): Payer: BC Managed Care – PPO | Admitting: Pulmonary Disease

## 2012-05-07 VITALS — BP 106/70 | HR 93 | Temp 98.1°F | Ht 61.0 in | Wt 211.0 lb

## 2012-05-07 DIAGNOSIS — R0602 Shortness of breath: Secondary | ICD-10-CM

## 2012-05-07 DIAGNOSIS — K219 Gastro-esophageal reflux disease without esophagitis: Secondary | ICD-10-CM

## 2012-05-07 DIAGNOSIS — J383 Other diseases of vocal cords: Secondary | ICD-10-CM

## 2012-05-07 HISTORY — DX: Gastro-esophageal reflux disease without esophagitis: K21.9

## 2012-05-07 NOTE — Patient Instructions (Signed)
Follow the lifestyle modifications for Gastric Reflux we provided you We will make a referral to the [facility] for Voice and Swallowing disorders for evaluation and management of your vocal cord dysfunction.  We will see you back as needed

## 2012-05-07 NOTE — Assessment & Plan Note (Signed)
Grace [name]'s methacholine challenge test was negative. Based on this she does not have asthma. I believe she has vocal cord dysfunction based on the sensation of her throat closing up associated with anxiety and reflux. Reflux is likely making this worse.  Plan: -Refer to the voice and swallowing disorders clinic at [facility] (she works in [place] and would prefer to be seen there.) -Agree with gastroesophageal reflux therapy and GI evaluation -No indication for inhaled steroid -Followup with [place] when necessary

## 2012-05-07 NOTE — Assessment & Plan Note (Signed)
I believe this is making her vocal cord dysfunction worse.  Plan: -We reviewed lifestyle modification for gastroesophageal reflux disease -Continue PPI at dose -Agree with GI evaluation

## 2012-05-07 NOTE — Progress Notes (Signed)
Subjective:    Patient ID: Grace [name], female    DOB: October 21, 1965, 47 y.o.   MRN: [id number]  Synopsis: Grace [name] has allergic rhinitis and had been labeled as asthma prior to her first visit with [facility] in February 2014.  On that visit she stated that she had worried that she had vocal cord dysfunction.  She was then sent for a methacholine challenge test that was negative.    HPI  05/07/12 ROV -- Grace [name] stopped using her ciclesonide inhaler at my instruction and states that she has not had worsening symptoms.  However, she continues to have the sensation that her upper airway is closing up when she is stressed and when she has a lot of heartburn.  She states that her allergies have been fairly well controlled lately.  She uses her Dexilant pill daily.    Past Medical History  Diagnosis Date  . Allergic rhinitis   . Asthma      Review of Systems  Constitutional: Negative for fever, chills and fatigue.  HENT: Negative for nosebleeds, congestion and rhinorrhea.   Respiratory: Positive for shortness of breath. Negative for choking, chest tightness and wheezing.   Cardiovascular: Negative for chest pain, palpitations and leg swelling.       Objective:   Physical Exam  Filed Vitals:   05/07/12 1630  BP: 106/70  Pulse: 93  Temp: 98.1 F (36.7 C)  TempSrc: Oral  Height: 5\' 1"  (1.549 m)  Weight: 211 lb (95.709 kg)  SpO2: 98%   Gen: well appearing, no acute distress HEENT: NCAT, EOMi, OP clear PULM: CTA B, normal percussion CV: RRR, no mgr, no JVD AB: BS+, soft, nontender, no hsm Ext: warm, no edema, no clubbing, no cyanosis      Assessment & Plan:   DYSPNEA Grace [name]'s methacholine challenge test was negative. Based on this she does not have asthma. I believe she has vocal cord dysfunction based on the sensation of her throat closing up associated with anxiety and reflux. Reflux is likely making this worse.  Plan: -Refer to the voice and  swallowing disorders clinic at [facility] (she works in [place] and would prefer to be seen there.) -Agree with gastroesophageal reflux therapy and GI evaluation -No indication for inhaled steroid -Followup with [place] when necessary  GERD (gastroesophageal reflux disease) I believe this is making her vocal cord dysfunction worse.  Plan: -We reviewed lifestyle modification for gastroesophageal reflux disease -Continue PPI at dose -Agree with GI evaluation   Updated Medication List Outpatient Encounter Prescriptions as of 05/07/2012  Medication Sig Dispense Refill  . albuterol (PROVENTIL,VENTOLIN) 90 MCG/ACT inhaler Inhale into the lungs as needed.        Marland Kitchen dexlansoprazole (DEXILANT) 60 MG capsule Take 60 mg by mouth daily.      . mometasone (NASONEX) 50 MCG/ACT nasal spray Place 2 sprays into the nose daily.        . Multiple Vitamins-Minerals (MULTIVITAL) tablet Take 1 tablet by mouth daily.        Marland Kitchen PARoxetine (PAXIL) 10 MG tablet Take 10 mg by mouth daily.        . Vitamin D, Ergocalciferol, (DRISDOL) 50000 UNITS CAPS Take 50,000 Units by mouth every 7 (seven) days.      . [DISCONTINUED] ciclesonide (ALVESCO) 160 MCG/ACT inhaler Inhale 1 puff into the lungs 2 (two) times daily.      . [DISCONTINUED] vitamin D, CHOLECALCIFEROL, 400 UNITS tablet Take 400 Units by mouth daily.  No facility-administered encounter medications on file as of 05/07/2012.

## 2013-10-16 ENCOUNTER — Other Ambulatory Visit: Payer: Self-pay | Admitting: Obstetrics and Gynecology

## 2013-10-16 DIAGNOSIS — R928 Other abnormal and inconclusive findings on diagnostic imaging of breast: Secondary | ICD-10-CM

## 2013-10-24 ENCOUNTER — Ambulatory Visit
Admission: RE | Admit: 2013-10-24 | Discharge: 2013-10-24 | Disposition: A | Discharge status: Home / Self Care | Payer: BC Managed Care – PPO | Source: Ambulatory Visit | Attending: Obstetrics and Gynecology | Admitting: Obstetrics and Gynecology

## 2013-10-24 DIAGNOSIS — R928 Other abnormal and inconclusive findings on diagnostic imaging of breast: Secondary | ICD-10-CM

## 2013-12-09 DIAGNOSIS — L501 Idiopathic urticaria: Secondary | ICD-10-CM | POA: Insufficient documentation

## 2013-12-09 DIAGNOSIS — R21 Rash and other nonspecific skin eruption: Secondary | ICD-10-CM | POA: Insufficient documentation

## 2013-12-09 DIAGNOSIS — T781XXA Other adverse food reactions, not elsewhere classified, initial encounter: Secondary | ICD-10-CM | POA: Insufficient documentation

## 2017-01-23 DIAGNOSIS — Z86018 Personal history of other benign neoplasm: Secondary | ICD-10-CM

## 2017-01-23 HISTORY — DX: Personal history of other benign neoplasm: Z86.018

## 2017-02-07 ENCOUNTER — Other Ambulatory Visit: Payer: Self-pay | Admitting: Obstetrics and Gynecology

## 2017-02-07 DIAGNOSIS — R928 Other abnormal and inconclusive findings on diagnostic imaging of breast: Secondary | ICD-10-CM

## 2017-02-13 ENCOUNTER — Ambulatory Visit
Admission: RE | Admit: 2017-02-13 | Discharge: 2017-02-13 | Disposition: A | Discharge status: Home / Self Care | Payer: BLUE CROSS/BLUE SHIELD | Source: Ambulatory Visit | Attending: Obstetrics and Gynecology | Admitting: Obstetrics and Gynecology

## 2017-02-13 ENCOUNTER — Ambulatory Visit: Payer: Self-pay

## 2017-02-13 DIAGNOSIS — R928 Other abnormal and inconclusive findings on diagnostic imaging of breast: Secondary | ICD-10-CM

## 2017-02-26 ENCOUNTER — Emergency Department: Payer: BLUE CROSS/BLUE SHIELD

## 2017-02-26 ENCOUNTER — Other Ambulatory Visit: Payer: Self-pay

## 2017-02-26 ENCOUNTER — Emergency Department
Admission: EM | Admit: 2017-02-26 | Discharge: 2017-02-26 | Disposition: A | Discharge status: Home / Self Care | Payer: BLUE CROSS/BLUE SHIELD | Attending: Emergency Medicine | Admitting: Emergency Medicine

## 2017-02-26 ENCOUNTER — Encounter: Payer: Self-pay | Admitting: *Deleted

## 2017-02-26 DIAGNOSIS — R062 Wheezing: Secondary | ICD-10-CM

## 2017-02-26 DIAGNOSIS — R05 Cough: Secondary | ICD-10-CM

## 2017-02-26 DIAGNOSIS — Z79899 Other long term (current) drug therapy: Secondary | ICD-10-CM | POA: Diagnosis not present

## 2017-02-26 DIAGNOSIS — Y999 Unspecified external cause status: Secondary | ICD-10-CM | POA: Diagnosis not present

## 2017-02-26 DIAGNOSIS — J45909 Unspecified asthma, uncomplicated: Secondary | ICD-10-CM | POA: Diagnosis not present

## 2017-02-26 DIAGNOSIS — X58XXXA Exposure to other specified factors, initial encounter: Secondary | ICD-10-CM | POA: Insufficient documentation

## 2017-02-26 DIAGNOSIS — Y939 Activity, unspecified: Secondary | ICD-10-CM | POA: Insufficient documentation

## 2017-02-26 DIAGNOSIS — R059 Cough, unspecified: Secondary | ICD-10-CM

## 2017-02-26 DIAGNOSIS — T17908A Unspecified foreign body in respiratory tract, part unspecified causing other injury, initial encounter: Secondary | ICD-10-CM | POA: Insufficient documentation

## 2017-02-26 DIAGNOSIS — Y929 Unspecified place or not applicable: Secondary | ICD-10-CM | POA: Insufficient documentation

## 2017-02-26 MED ORDER — PREDNISONE 20 MG PO TABS: 40.0000 mg | ORAL_TABLET | Freq: Every day | ORAL | 0 refills | Status: AC

## 2017-02-26 MED ORDER — AZITHROMYCIN 500 MG PO TABS
500.0000 mg | ORAL_TABLET | Freq: Once | ORAL | Status: AC
  Administered 2017-02-26: 500 mg via ORAL
  Filled 2017-02-26: qty 1

## 2017-02-26 MED ORDER — IPRATROPIUM-ALBUTEROL 0.5-2.5 (3) MG/3ML IN SOLN
3.0000 mL | Freq: Once | RESPIRATORY_TRACT | Status: AC
  Administered 2017-02-26: 3 mL via RESPIRATORY_TRACT
  Filled 2017-02-26: qty 3

## 2017-02-26 MED ORDER — AZITHROMYCIN 250 MG PO TABS: ORAL_TABLET | ORAL | 0 refills | Status: AC

## 2017-02-26 MED ORDER — PREDNISONE 20 MG PO TABS
40.0000 mg | ORAL_TABLET | Freq: Once | ORAL | Status: AC
  Administered 2017-02-26: 40 mg via ORAL
  Filled 2017-02-26: qty 2

## 2017-02-26 MED ORDER — ALBUTEROL SULFATE HFA 108 (90 BASE) MCG/ACT IN AERS: 2.0000 | INHALATION_SPRAY | Freq: Four times a day (QID) | RESPIRATORY_TRACT | 2 refills | Status: DC | PRN

## 2017-02-26 NOTE — ED Provider Notes (Signed)
[facility] EMERGENCY DEPARTMENT Provider Note   CSN: [id number] Arrival date & time: 02/26/17  2026     History   Chief Complaint Chief Complaint  Patient presents with  . Cough    HPI Grace [name] is a 51 y.o. female presents to the emergency department for evaluation of cough and wheezing.  Patient states around 7:15 PM tonight she was swallowing water with lemon juice when she laughed and aspirated the mouth full of water.  Patient states since then she has had a horrible cough with wheezing.  She denies any chest pain.  She is able to speak full sentences.  She has not taken any medications for symptoms.  She denies any prior cough congestion or fevers.  No history of COPD or asthma.  HPI  Past Medical History:  Diagnosis Date  . Allergic rhinitis   . Asthma     Patient Active Problem List   Diagnosis Date Noted  . GERD (gastroesophageal reflux disease) 05/07/2012  . HYPERLIPIDEMIA-MIXED 09/14/2009  . DYSPNEA 09/14/2009  . OTHER CHEST PAIN 09/14/2009  . ONYCHIA AND PARONYCHIA OF FINGER 11/11/2007  . HYPOGLYCEMIA 11/08/2007  . HEMORRHOIDS 11/08/2007  . ALLERGIC RHINITIS 11/08/2007  . 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 caesarean section    . htn     pulmonary edema with childbith, had cardiac w/ up  . SPIROMETRY  12/07   at allergist's- normal    OB History    No data available       Home Medications    Prior to Admission medications   Medication Sig Start Date End Date Taking? Authorizing Provider  albuterol (PROVENTIL HFA;VENTOLIN HFA) 108 (90 Base) MCG/ACT inhaler Inhale 2 puffs into the lungs every 6 (six) hours as needed for wheezing or shortness of breath. 02/26/17   Duanne Guess, PA-C  albuterol (PROVENTIL,VENTOLIN) 90 MCG/ACT inhaler Inhale into the lungs as needed.      [provider]  azithromycin (ZITHROMAX Z-PAK) 250 MG tablet Take 2 tablets (500 mg) on  Day  1,  followed by 1 tablet (250 mg) once daily on Days 2 through 5. 02/26/17 03/03/17  Duanne Guess, PA-C  dexlansoprazole (DEXILANT) 60 MG capsule Take 60 mg by mouth daily.    [provider]  mometasone (NASONEX) 50 MCG/ACT nasal spray Place 2 sprays into the nose daily.      [provider]  Multiple Vitamins-Minerals (MULTIVITAL) tablet Take 1 tablet by mouth daily.      [provider]  PARoxetine (PAXIL) 10 MG tablet Take 10 mg by mouth daily.      [provider]  predniSONE (DELTASONE) 20 MG tablet Take 2 tablets (40 mg total) by mouth daily for 4 days. 02/26/17 03/02/17  Duanne Guess, PA-C  Vitamin D, Ergocalciferol, (DRISDOL) 50000 UNITS CAPS Take 50,000 Units by mouth every 7 (seven) days.    [provider]    Family History Family History  Problem Relation Age of Onset  . Hyperlipidemia Mother   . Hypertension Mother   . Hypertension Father   . Hyperlipidemia Father   . Breast cancer Maternal Aunt 51  . Breast cancer Maternal Grandmother 37    Social History Social History   Tobacco Use  . Smoking status: Never Smoker  . Smokeless tobacco: Never Used  Substance Use Topics  . Alcohol use: Yes    Comment: occas  . Drug use: No  Allergies   Cephalexin; Penicillins; and Zyrtec [cetirizine]   Review of Systems Review of Systems  Constitutional: Negative for fever.  Respiratory: Positive for cough and wheezing. Negative for shortness of breath.   Cardiovascular: Negative for chest pain.  Gastrointestinal: Negative for abdominal pain.  Genitourinary: Negative for difficulty urinating, dysuria and urgency.  Musculoskeletal: Negative for back pain and myalgias.  Skin: Negative for rash.  Neurological: Negative for dizziness and headaches.     Physical Exam Updated Vital Signs BP (!) 116/45 (BP Location: Left Arm)   Pulse 85   Temp 98.2 F (36.8 C) (Oral)   Resp 20   Wt 95.7 kg (211 lb)   SpO2 100%   BMI  39.87 kg/m   Physical Exam  Constitutional: She is oriented to person, place, and time. She appears well-developed and well-nourished. No distress.  HENT:  Head: Normocephalic and atraumatic.  Mouth/Throat: Oropharynx is clear and moist.  Eyes: EOM are normal. Pupils are equal, round, and reactive to light. Right eye exhibits no discharge. Left eye exhibits no discharge.  Neck: Normal range of motion. Neck supple.  Cardiovascular: Normal rate, regular rhythm and intact distal pulses.  Pulmonary/Chest: Effort normal. No respiratory distress. She has wheezes. She exhibits no tenderness.  Musculoskeletal: Normal range of motion. She exhibits no edema.  Neurological: She is alert and oriented to person, place, and time. She has normal reflexes.  Skin: Skin is warm and dry.  Psychiatric: She has a normal mood and affect. Her behavior is normal. Thought content normal.     ED Treatments / Results  Labs (all labs ordered are listed, but only abnormal results are displayed) Labs Reviewed - No data to display  EKG  EKG Interpretation None       Radiology Dg Chest 2 View  Result Date: 02/26/2017 CLINICAL DATA:  Aspiration. EXAM: CHEST  2 VIEW COMPARISON:  March 12, 2012 FINDINGS: The heart size and mediastinal contours are within normal limits. Both lungs are clear. The visualized skeletal structures are unremarkable. IMPRESSION: No active cardiopulmonary disease. Electronically Signed   By: Dorise Bullion III M.D   On: 02/26/2017 21:45    Procedures Procedures (including critical care time)  Medications Ordered in ED Medications  azithromycin (ZITHROMAX) tablet 500 mg (not administered)  predniSONE (DELTASONE) tablet 40 mg (not administered)  ipratropium-albuterol (DUONEB) 0.5-2.5 (3) MG/3ML nebulizer solution 3 mL (3 mLs Nebulization Given 02/26/17 2140)     Initial Impression / Assessment and Plan / ED Course  I have reviewed the triage vital signs and the nursing  notes.  Pertinent labs & imaging results that were available during my care of the patient were reviewed by me and considered in my medical decision making (see chart for details).     52 year old female aspirated water with lemon juice earlier today.  She developed significant cough with wheezing.  After DuoNeb treatment x1 symptoms of wheezing and coughing completely resolved.  She is no longer having any wheezing, moving air well.  Vital signs are normal.  Patient placed on prophylactic antibiotic, azithromycin.  She is given prescription for albuterol and placed on a 5-day course of prednisone.  Patient is educated on signs and symptoms to return to the emergency department for.  Final Clinical Impressions(s) / ED Diagnoses   Final diagnoses:  Cough  Wheezing  Aspiration into airway, initial encounter    ED Discharge Orders        Ordered    azithromycin (ZITHROMAX Z-PAK) 250 MG tablet  02/26/17 2218    albuterol (PROVENTIL HFA;VENTOLIN HFA) 108 (90 Base) MCG/ACT inhaler  Every 6 hours PRN     02/26/17 2218    predniSONE (DELTASONE) 20 MG tablet  Daily     02/26/17 2218       Renata Caprice 02/26/17 2232    Harvest Dark, MD 02/26/17 2249

## 2017-02-26 NOTE — Discharge Instructions (Signed)
Please take medications as prescribed.  Return to the emergency department for any fevers, worsening cough, shortness of breath or any urgent changes in her health.

## 2017-02-26 NOTE — ED Triage Notes (Signed)
Pt was drinking lemon water and reports it went down the wrong way, pt coughing in triage.  Pt alert  Speech clear.

## 2017-02-26 NOTE — ED Notes (Signed)
Pt reports she was drinking water one hour ago and laughed - at that time she got choked and started coughing and "wheezing" - she feels like the acid in the lemon is aggravating symptoms

## 2017-05-06 ENCOUNTER — Emergency Department
Admission: EM | Admit: 2017-05-06 | Discharge: 2017-05-07 | Disposition: A | Discharge status: Home / Self Care | Payer: BLUE CROSS/BLUE SHIELD | Attending: Emergency Medicine | Admitting: Emergency Medicine

## 2017-05-06 ENCOUNTER — Encounter: Payer: Self-pay | Admitting: Emergency Medicine

## 2017-05-06 ENCOUNTER — Emergency Department: Payer: BLUE CROSS/BLUE SHIELD

## 2017-05-06 DIAGNOSIS — Z79899 Other long term (current) drug therapy: Secondary | ICD-10-CM | POA: Insufficient documentation

## 2017-05-06 DIAGNOSIS — K8050 Calculus of bile duct without cholangitis or cholecystitis without obstruction: Secondary | ICD-10-CM | POA: Insufficient documentation

## 2017-05-06 DIAGNOSIS — J45909 Unspecified asthma, uncomplicated: Secondary | ICD-10-CM | POA: Insufficient documentation

## 2017-05-06 DIAGNOSIS — K2980 Duodenitis without bleeding: Secondary | ICD-10-CM | POA: Diagnosis not present

## 2017-05-06 DIAGNOSIS — N390 Urinary tract infection, site not specified: Secondary | ICD-10-CM | POA: Diagnosis not present

## 2017-05-06 DIAGNOSIS — R1010 Upper abdominal pain, unspecified: Secondary | ICD-10-CM | POA: Diagnosis present

## 2017-05-06 LAB — URINALYSIS, COMPLETE (UACMP) WITH MICROSCOPIC
BILIRUBIN URINE: NEGATIVE
GLUCOSE, UA: NEGATIVE mg/dL
HGB URINE DIPSTICK: NEGATIVE
KETONES UR: NEGATIVE mg/dL
NITRITE: NEGATIVE
Protein, ur: NEGATIVE mg/dL
Specific Gravity, Urine: 1.018 (ref 1.005–1.030)
pH: 5 (ref 5.0–8.0)

## 2017-05-06 LAB — COMPREHENSIVE METABOLIC PANEL
ALBUMIN: 4 g/dL (ref 3.5–5.0)
ALK PHOS: 124 U/L (ref 38–126)
ALT: 18 U/L (ref 14–54)
ANION GAP: 6 (ref 5–15)
AST: 18 U/L (ref 15–41)
BILIRUBIN TOTAL: 0.4 mg/dL (ref 0.3–1.2)
BUN: 11 mg/dL (ref 6–20)
CO2: 26 mmol/L (ref 22–32)
Calcium: 8.5 mg/dL — ABNORMAL LOW (ref 8.9–10.3)
Chloride: 108 mmol/L (ref 101–111)
Creatinine, Ser: 0.91 mg/dL (ref 0.44–1.00)
GFR calc non Af Amer: >60 mL/min (ref >60)
GLUCOSE: 125 mg/dL — AB (ref 65–99)
POTASSIUM: 3.5 mmol/L (ref 3.5–5.1)
Sodium: 140 mmol/L (ref 135–145)
TOTAL PROTEIN: 7.3 g/dL (ref 6.5–8.1)

## 2017-05-06 LAB — CBC
HEMATOCRIT: 37.2 % (ref 35.0–47.0)
HEMOGLOBIN: 12.5 g/dL (ref 12.0–16.0)
MCH: 27.9 pg (ref 26.0–34.0)
MCHC: 33.7 g/dL (ref 32.0–36.0)
MCV: 82.8 fL (ref 80.0–100.0)
Platelets: 258 10*3/uL (ref 150–440)
RBC: 4.49 MIL/uL (ref 3.80–5.20)
RDW: 15.6 % — ABNORMAL HIGH (ref 11.5–14.5)
WBC: 9.8 10*3/uL (ref 3.6–11.0)

## 2017-05-06 LAB — LIPASE, BLOOD: Lipase: 40 U/L (ref 11–51)

## 2017-05-06 MED ORDER — IOHEXOL 300 MG/ML  SOLN
100.0000 mL | Freq: Once | INTRAMUSCULAR | Status: AC | PRN
  Administered 2017-05-06: 100 mL via INTRAVENOUS

## 2017-05-06 MED ORDER — MORPHINE SULFATE (PF) 4 MG/ML IV SOLN
4.0000 mg | Freq: Once | INTRAVENOUS | Status: AC
  Administered 2017-05-06: 4 mg via INTRAVENOUS
  Filled 2017-05-06: qty 1

## 2017-05-06 MED ORDER — GI COCKTAIL ~~LOC~~
30.0000 mL | Freq: Once | ORAL | Status: AC
  Administered 2017-05-06: 30 mL via ORAL
  Filled 2017-05-06: qty 30

## 2017-05-06 MED ORDER — ONDANSETRON HCL 4 MG/2ML IJ SOLN
4.0000 mg | Freq: Once | INTRAMUSCULAR | Status: AC
  Administered 2017-05-06: 4 mg via INTRAVENOUS
  Filled 2017-05-06: qty 2

## 2017-05-06 NOTE — ED Notes (Signed)
Pt states the pain is in her back and upper mid abdomen. No burning with urination or bladder spasms.

## 2017-05-06 NOTE — ED Notes (Signed)
GI cocktail held at this time due to pt's nausea.

## 2017-05-06 NOTE — ED Triage Notes (Addendum)
Pt comes into the ED via POV c/o mid abdominal pain that radiates into her back.  Patient denies any N/V/D and denies any cardiac problems.  Denies any dizziness or shortness of breath. Patient states the pain began after eating some snacks in the car but denies it being related to any normal meal.  Patient in NAD at this time with even and unlabored respirations. Patient states her physician has been monitoring her gallbladder, but patient also states she consumed larger amounts of alcohol last night than her normal.  Patient explains that the pain is sharp pain in her back and pressure and bloating in her abdomen.

## 2017-05-06 NOTE — ED Notes (Signed)
Pt vomited after GI cocktail but states that she feels better at this time.

## 2017-05-06 NOTE — ED Provider Notes (Addendum)
[facility] Emergency Department Provider Note   ____________________________________________   First MD Initiated Contact with Patient 05/06/17 2301     (approximate)  I have reviewed the triage vital signs and the nursing notes.   HISTORY  Chief Complaint Abdominal Pain and Back Pain   HPI Grace [name] is a 52 y.o. female Patient reports her doctors and watching her gallbladder and last night she had more alcohol than usual but today she has a sharp pain in her epigastric area that radiates she says around to her back. She has no nausea vomiting or diarrhea no fever no chills no dysuria urgency or frequency. She said Tums did not make the pain change at all. She does take omeprazole already. The pain is currently severe and constant and sharp. Worse with palpation.   Past Medical History:  Diagnosis Date  . Allergic rhinitis   . Asthma     Patient Active Problem List   Diagnosis Date Noted  . GERD (gastroesophageal reflux disease) 05/07/2012  . HYPERLIPIDEMIA-MIXED 09/14/2009  . DYSPNEA 09/14/2009  . OTHER CHEST PAIN 09/14/2009  . ONYCHIA AND PARONYCHIA OF FINGER 11/11/2007  . HYPOGLYCEMIA 11/08/2007  . HEMORRHOIDS 11/08/2007  . ALLERGIC RHINITIS 11/08/2007  . 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 caesarean section    . htn     pulmonary edema with childbith, had cardiac w/ up  . SPIROMETRY  12/07   at allergist's- normal    Prior to Admission medications   Medication Sig Start Date End Date Taking? Authorizing Provider  albuterol (PROVENTIL HFA;VENTOLIN HFA) 108 (90 Base) MCG/ACT inhaler Inhale 2 puffs into the lungs every 6 (six) hours as needed for wheezing or shortness of breath. 02/26/17   Duanne Guess, PA-C  albuterol (PROVENTIL,VENTOLIN) 90 MCG/ACT inhaler Inhale into the lungs as needed.      [provider]  dexlansoprazole (DEXILANT) 60 MG capsule Take 60 mg by mouth  daily.    [provider]  mometasone (NASONEX) 50 MCG/ACT nasal spray Place 2 sprays into the nose daily.      [provider]  Multiple Vitamins-Minerals (MULTIVITAL) tablet Take 1 tablet by mouth daily.      [provider]  PARoxetine (PAXIL) 10 MG tablet Take 10 mg by mouth daily.      [provider]  Vitamin D, Ergocalciferol, (DRISDOL) 50000 UNITS CAPS Take 50,000 Units by mouth every 7 (seven) days.    [provider]    Allergies Cephalexin; Penicillins; and Zyrtec [cetirizine]  Family History  Problem Relation Age of Onset  . Hyperlipidemia Mother   . Hypertension Mother   . Hypertension Father   . Hyperlipidemia Father   . Breast cancer Maternal Aunt 51  . Breast cancer Maternal Grandmother 63    Social History Social History   Tobacco Use  . Smoking status: Never Smoker  . Smokeless tobacco: Never Used  Substance Use Topics  . Alcohol use: Yes    Comment: occas  . Drug use: No    Review of Systems  Constitutional: No fever/chills Eyes: No visual changes. ENT: No sore throat. Cardiovascular: Denies chest pain. Respiratory: Denies shortness of breath. Gastrointestinal: see history of present illnessshe does have minimal right CVA tenderness Genitourinary: Negative for dysuria. Musculoskeletal: Negative for back pain. Skin: Negative for rash. Neurological: Negative for headaches, focal weakness  ____________________________________________   PHYSICAL EXAM:  VITAL SIGNS: ED Triage Vitals  Enc  Vitals Group     BP 05/06/17 2105 (!) 126/56     Pulse Rate 05/06/17 2105 79     Resp 05/06/17 2105 18     Temp 05/06/17 2105 98.6 F (37 C)     Temp Source 05/06/17 2105 Oral     SpO2 05/06/17 2105 95 %     Weight --      Height 05/06/17 2105 5\' 1"  (1.549 m)     Head Circumference --      Peak Flow --      Pain Score 05/06/17 2111 7     Pain Loc --      Pain Edu? --      Excl. in [place]? --     Constitutional:  Alert and oriented. Well appearing and in no acute distress. Eyes: Conjunctivae are normal.  Head: Atraumatic. Nose: No congestion/rhinnorhea. Mouth/Throat: Mucous membranes are moist.  Oropharynx non-erythematous. Neck: No stridor.   Cardiovascular: Normal rate, regular rhythm. Grossly normal heart sounds.  Good peripheral circulation. Respiratory: Normal respiratory effort.  No retractions. Lungs CTAB. Gastrointestinal: Soft and nontender. No distention. No abdominal bruits. minimal right CVA tenderness. Musculoskeletal: No lower extremity tenderness nor edema.  No joint effusions. Neurologic:  Normal speech and language. No gross focal neurologic deficits are appreciated. No gait instability. Skin:  Skin is warm, dry and intact. No rash noted. Psychiatric: Mood and affect are normal. Speech and behavior are normal.  ____________________________________________   LABS (all labs ordered are listed, but only abnormal results are displayed)  Labs Reviewed  COMPREHENSIVE METABOLIC PANEL - Abnormal; Notable for the following components:      Result Value   Glucose, Bld 125 (*)    Calcium 8.5 (*)    All other components within normal limits  CBC - Abnormal; Notable for the following components:   RDW 15.6 (*)    All other components within normal limits  URINALYSIS, COMPLETE (UACMP) WITH MICROSCOPIC - Abnormal; Notable for the following components:   Color, Urine YELLOW (*)    APPearance CLOUDY (*)    Leukocytes, UA LARGE (*)    Bacteria, UA RARE (*)    Squamous Epithelial / LPF 6-30 (*)    All other components within normal limits  LIPASE, BLOOD   ____________________________________________  EKG EKG read and interpreted by me shows normal sinus rhythm rate of 74 normal axis no acute changes  ____________________________________________  RADIOLOGY  ED MD interpretation:   Official radiology report(s): No results  found.  ____________________________________________   PROCEDURES  Procedure(s) performed:   Procedures  Critical Care performed:   ____________________________________________   INITIAL IMPRESSION / ASSESSMENT AND PLAN / ED COURSE  will try GI cocktail to see if that helps so if the Tums didn't I doubt it well. We'll give her some morphine for pain control and CT her belly. Her labs are essentially normal. except for the urinalysis I will sign the patient out to Dr Beather Arbour.  Clinical Course as of May 06 2316  Sun May 06, 2017  2301 Total Protein: 7.3 [PM]    Clinical Course User Index [PM] Nena Polio, MD     ____________________________________________   FINAL CLINICAL IMPRESSION(S) / ED DIAGNOSES  Final diagnoses:  Pain of upper abdomen     ED Discharge Orders    None       Note:  This document was prepared using Dragon voice recognition software and may include unintentional dictation errors.    Nena Polio, MD 05/06/17  2130    Nena Polio, MD 05/24/17 1322

## 2017-05-07 ENCOUNTER — Emergency Department: Payer: BLUE CROSS/BLUE SHIELD

## 2017-05-07 MED ORDER — NITROFURANTOIN MONOHYD MACRO 100 MG PO CAPS: 100.0000 mg | ORAL_CAPSULE | Freq: Two times a day (BID) | ORAL | 0 refills | Status: DC

## 2017-05-07 MED ORDER — SUCRALFATE 1 GM/10ML PO SUSP: 1.0000 g | Freq: Four times a day (QID) | ORAL | 1 refills | Status: DC

## 2017-05-07 MED ORDER — NITROFURANTOIN MONOHYD MACRO 100 MG PO CAPS
100.0000 mg | ORAL_CAPSULE | Freq: Once | ORAL | Status: AC
  Administered 2017-05-07: 100 mg via ORAL
  Filled 2017-05-07: qty 1

## 2017-05-07 MED ORDER — ONDANSETRON 4 MG PO TBDP: 4.0000 mg | ORAL_TABLET | Freq: Three times a day (TID) | ORAL | 0 refills | Status: DC | PRN

## 2017-05-07 MED ORDER — FENTANYL CITRATE (PF) 100 MCG/2ML IJ SOLN
50.0000 ug | Freq: Once | INTRAMUSCULAR | Status: AC
  Administered 2017-05-07: 50 ug via INTRAVENOUS
  Filled 2017-05-07: qty 2

## 2017-05-07 MED ORDER — OXYCODONE-ACETAMINOPHEN 5-325 MG PO TABS: 1.0000 | ORAL_TABLET | ORAL | 0 refills | Status: DC | PRN

## 2017-05-07 MED ORDER — OXYCODONE-ACETAMINOPHEN 5-325 MG PO TABS
1.0000 | ORAL_TABLET | Freq: Once | ORAL | Status: AC
  Administered 2017-05-07: 1 via ORAL
  Filled 2017-05-07: qty 1

## 2017-05-07 NOTE — ED Provider Notes (Signed)
-----------------------------------------   12:24 AM on 05/07/2017 -----------------------------------------  Patient vomited after GI cocktail but now states she is feeling better.  Updated patient and family members of CT results.  Patient's pain is in her epigastrium; more likely duodenitis is the etiology of patient's pain.  However, given the finding of a stone in her cystic duct, and patient has never had not had an abdominal ultrasound since 2013, will proceed with right upper quadrant abdominal ultrasound to further evaluate.   ----------------------------------------- 1:56 AM on 05/07/2017 -----------------------------------------  Abdominal ultrasound interpreted per Dr. Francoise Ceo: 1. Gallstones with non mobile stone in the gallbladder neck but  negative for [place] thickening or sonographic [place].  2. Negative for biliary dilatation  3. Hepatic steatosis   Patient resting no acute distress.  Updated patient and family members of ultrasound results.  Will discharge home with follow-up with both general surgery as well as GI.  Encouraged her to take over-the-counter Zantac or Pepcid in addition to the omeprazole she is already taking daily.  Will discharge home with prescription in for Carafate, Percocet/Zofran as needed for biliary colic, Macrobid for UTI.  Strict return precautions given.  All verbalize understanding and agree with plan of care.   Paulette Blanch, MD 05/07/17 [phone]

## 2017-05-07 NOTE — Discharge Instructions (Addendum)
1.  Take antibiotic as prescribed for UTI (Macrobid 100 mg twice daily for 7 days). 2.  You may take over-the-counter Zantac or Pepcid in addition to the Omeprazole you are already taking. 3.  Start Carafate 3 times daily with meals and at bedtime. 4.  You may take medicines as needed for pain and nausea (Percocet/Zofran #30). 5.  Eat a bland diet for the next week, then slowly advance diet as tolerated.  Avoid heavy, greasy foods and alcohol. 6.  Return to the ER for worsening symptoms, persistent vomiting, difficulty breathing or other concerns.

## 2017-05-08 ENCOUNTER — Encounter: Payer: Self-pay | Admitting: Surgery

## 2017-05-08 ENCOUNTER — Ambulatory Visit (INDEPENDENT_AMBULATORY_CARE_PROVIDER_SITE_OTHER): Payer: BLUE CROSS/BLUE SHIELD | Admitting: Surgery

## 2017-05-08 VITALS — BP 121/61 | HR 67 | Ht 61.0 in | Wt 223.0 lb

## 2017-05-08 DIAGNOSIS — K8050 Calculus of bile duct without cholangitis or cholecystitis without obstruction: Secondary | ICD-10-CM

## 2017-05-08 NOTE — Patient Instructions (Signed)

## 2017-05-08 NOTE — Progress Notes (Signed)
Grace [name] is an 52 y.o. female.   Chief Complaint: Right upper quadrant pain Consult requested by Dr. Ronnald Collum HPI: This patient with gallstones and right upper quadrant pain.  Patient was in the emergency room with the acute onset of back pain and epigastric pain radiating across both upper quadrants.  It started after a fatty meal and was made worse by another fatty meal on the drive home from [facility].  She had no nausea and no vomiting no acholic stools and no fevers or chills.  She was diagnosed with gallstones in the emergency room.  Her mother had gallbladder problems.  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  Past Medical History:  Diagnosis Date  . 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 Allergic rhinitis   . Asthma   . GERD (gastroesophageal reflux disease) 05/07/2012  . HYPOGLYCEMIA 11/08/2007   Qualifier: Diagnosis of  By: Marcelino Scot CMA, [place] Bilberry    . Other chest pain 09/14/2009   Qualifier: Diagnosis of  By: Rockey Situ MD, Tim      Past Surgical History:  Procedure Laterality Date  . caesarean section    . htn     pulmonary edema with childbith, had cardiac w/ up  . SPIROMETRY  12/07   at allergist's- normal    Family History  Problem Relation Age of Onset  . Hyperlipidemia Mother   . Hypertension Mother   . Hypertension Father   . Hyperlipidemia Father   . Breast cancer Maternal Aunt 51  . Breast cancer Maternal Grandmother 59   Social History:  reports that she has never smoked. She has never used smokeless tobacco. She reports that she drinks alcohol. She reports that she does not use drugs.  Allergies:  Allergies  Allergen Reactions  . Cephalexin     REACTION: (hives)  . Eggs Or Egg-Derived Products Itching    Mouth itching  . Gluten Meal Swelling    Swelling of tongue, hives  . Lactase  Itching    Mouth itch  . Penicillins     REACTION: (child)  . Tomato Swelling    Hives, mouth swelling  . Zyrtec [Cetirizine]      (Not in a hospital admission)   Review of Systems:   Review of Systems  Constitutional: Negative.   HENT: Negative.   Eyes: Negative.   Respiratory: Negative.   Cardiovascular: Negative.   Gastrointestinal: Positive for abdominal pain and constipation. Negative for blood in stool, diarrhea, heartburn, melena, nausea and vomiting.  Genitourinary: Negative.   Musculoskeletal: Negative.   Skin: Negative.   Neurological: Negative.   Endo/Heme/Allergies: Negative.   Psychiatric/Behavioral: Negative.     Physical Exam:  Physical Exam  Constitutional: She is oriented to person, place, and time. She appears well-nourished.  Obese  HENT:  Head: Normocephalic and atraumatic.  Eyes: Pupils are equal, round, and reactive to light. Right eye exhibits no discharge. Left eye exhibits no discharge. No scleral icterus.  Neck: Normal range of motion.  Cardiovascular: Normal rate, regular rhythm and normal heart sounds.  Pulmonary/Chest: Effort normal and breath sounds normal. No stridor. No respiratory distress. She has no wheezes.  Abdominal: Soft. She exhibits no distension and no mass. There is no tenderness. There is no guarding.  C-section scar nontender right upper quadrant with a negative Murphy sign  Musculoskeletal: Normal range of motion. She exhibits no edema.  Lymphadenopathy:    She has no cervical adenopathy.  Neurological: She is alert and oriented to person, place, and time.  Skin: Skin is warm and dry. No erythema.  Vitals reviewed.   There were no vitals taken for this visit.    Results for orders placed or performed during the hospital encounter of 05/06/17 (from the past 48 hour(s))  Lipase, blood     Status: None   Collection Time: 05/06/17  9:07 PM  Result Value Ref Range   Lipase 40 11 - 51 U/L    Comment: Performed at  [facility], [address] [place], [place] [address]  Comprehensive metabolic panel     Status: Abnormal   Collection Time: 05/06/17  9:07 PM  Result Value Ref Range   Sodium 140 135 - 145 mmol/L   Potassium 3.5 3.5 - 5.1 mmol/L   Chloride 108 101 - 111 mmol/L   CO2 26 22 - 32 mmol/L   Glucose, Bld 125 (H) 65 - 99 mg/dL   BUN 11 6 - 20 mg/dL   Creatinine, Ser 0.91 0.44 - 1.00 mg/dL   Calcium 8.5 (L) 8.9 - 10.3 mg/dL   Total Protein 7.3 6.5 - 8.1 g/dL   Albumin 4.0 3.5 - 5.0 g/dL   AST 18 15 - 41 U/L   ALT 18 14 - 54 U/L   Alkaline Phosphatase 124 38 - 126 U/L   Total Bilirubin 0.4 0.3 - 1.2 mg/dL   GFR calc non Af Amer >60 >60 mL/min   GFR calc Af Amer >60 >60 mL/min    Comment: (NOTE) The eGFR has been calculated using the CKD EPI equation. This calculation has not been validated in all clinical situations. eGFR's persistently <60 mL/min signify possible Chronic Kidney Disease.    Anion gap 6 5 - 15    Comment: Performed at [facility], [address] [place], [place] [address]  CBC     Status: Abnormal   Collection Time: 05/06/17  9:07 PM  Result Value Ref Range   WBC 9.8 3.6 - 11.0 K/uL   RBC 4.49 3.80 - 5.20 MIL/uL   Hemoglobin 12.5 12.0 - 16.0 g/dL   HCT 37.2 35.0 - 47.0 %   MCV 82.8 80.0 - 100.0 fL   MCH 27.9 26.0 - 34.0 pg   MCHC 33.7 32.0 - 36.0 g/dL   RDW 15.6 (H) 11.5 - 14.5 %   Platelets 258 150 - 440 K/uL    Comment: Performed at [facility], [address] [place], [place] [address]  Urinalysis, Complete w Microscopic     Status: Abnormal   Collection Time: 05/06/17  9:07 PM  Result Value Ref Range   Color, Urine YELLOW (A) YELLOW   APPearance CLOUDY (A) CLEAR   Specific Gravity, Urine 1.018 1.005 - 1.030   pH 5.0 5.0 - 8.0   Glucose, UA NEGATIVE NEGATIVE mg/dL   Hgb urine dipstick NEGATIVE NEGATIVE   Bilirubin Urine NEGATIVE NEGATIVE   Ketones, ur NEGATIVE NEGATIVE mg/dL   Protein, ur NEGATIVE NEGATIVE mg/dL    Nitrite NEGATIVE NEGATIVE   Leukocytes, UA LARGE (A) NEGATIVE   RBC / HPF 6-30 0 - 5 RBC/hpf   WBC, UA 6-30 0 - 5 WBC/hpf   Bacteria, UA RARE (A) NONE SEEN   Squamous Epithelial / LPF 6-30 (A) NONE SEEN   Mucus PRESENT     Comment: Performed at [facility], [place], [place] [address]   Ct Abdomen Pelvis W Contrast  Result Date: 05/06/2017 CLINICAL DATA:  Epigastric abdominal pain EXAM: CT ABDOMEN AND PELVIS WITH CONTRAST TECHNIQUE: Multidetector CT imaging of the abdomen and pelvis was performed using the standard protocol following bolus administration of intravenous contrast. CONTRAST:  126m OMNIPAQUE IOHEXOL 300 MG/ML  SOLN COMPARISON:  Right upper quadrant ultrasound dated 03/09/2011 FINDINGS: Lower chest: Lung bases are clear. Hepatobiliary: Liver is within normal limits. Distended gallbladder. Suspected 11 mm gallstone in the cystic duct (coronal image 40). No gallbladder Hippert thickening. Trace fluid/stranding in the right upper quadrant adjacent to the 2nd portion of the duodenum (series 2/image 37). Pancreas: Within normal limits. Spleen: Within normal limits. Adrenals/Urinary Tract: Adrenal glands are within normal limits. Kidneys are within normal limits.  No hydronephrosis. Bladder is underdistended but unremarkable. Stomach/Bowel: Stomach is within normal limits. No evidence of bowel obstruction. Normal appendix (series 2/image 63). Vascular/Lymphatic: No evidence of abdominal aortic aneurysm. No suspicious abdominopelvic lymphadenopathy. Reproductive: Uterus is within normal limits. Bilateral ovaries are within normal limits. Other: No abdominopelvic ascites. Musculoskeletal: Visualized osseous structures are within normal limits. IMPRESSION: Distended gallbladder with suspected 11 mm gallstone in the cystic duct. No convincing findings to suggest acute cholecystitis on the current CT. If symptoms persist, consider follow-up right upper quadrant ultrasound or  hepatobiliary nuclear medicine scan. Electronically Signed   By: SJulian HyM.D.   On: 05/06/2017 23:46   UKoreaAbdomen Limited Ruq  Result Date: 05/07/2017 CLINICAL DATA:  Possible cystic duct stone EXAM: ULTRASOUND ABDOMEN LIMITED RIGHT UPPER QUADRANT COMPARISON:  CT 05/06/2017 FINDINGS: Gallbladder: Multiple stones in the gallbladder measuring up to 11 mm. Non mobile stone at the gallbladder neck negative sonographic Murphy. Normal Stfort thickness. Common bile duct: Diameter: 5.9 mm Liver: Increased hepatic echogenicity. No focal abnormality. Portal vein is patent on color Doppler imaging with normal direction of blood flow towards the liver. IMPRESSION: 1. Gallstones with non mobile stone in the gallbladder neck but negative for Kohli thickening or sonographic Murphy. 2. Negative for biliary dilatation 3. Hepatic steatosis Electronically Signed   By: KDonavan FoilM.D.   On: 05/07/2017 01:32     Assessment/Plan Sound shows stones and LFTs are normal. This patient with a first and only episode of right upper quadrant pain following a fatty food meal.  This is likely representative of biliary colic.  Her pain is completely resolved at this time. I discussed with her the rationale for offering surgery and the risks of bleeding infection recurrence of symptoms would resolve all of her symptoms conversion to an open procedure I will duct damage bile duct leak or retained common bile duct stone any which could require further surgery she and her husband understood and agreed with this plan. RFlorene Glen MD, FACS

## 2017-05-09 ENCOUNTER — Ambulatory Visit: Payer: BLUE CROSS/BLUE SHIELD | Admitting: Gastroenterology

## 2017-05-09 ENCOUNTER — Encounter: Payer: Self-pay | Admitting: Gastroenterology

## 2017-05-09 ENCOUNTER — Other Ambulatory Visit: Payer: Self-pay

## 2017-05-09 VITALS — BP 115/73 | HR 60 | Temp 98.1°F | Ht 61.0 in | Wt 219.8 lb

## 2017-05-09 DIAGNOSIS — K219 Gastro-esophageal reflux disease without esophagitis: Secondary | ICD-10-CM | POA: Diagnosis not present

## 2017-05-09 DIAGNOSIS — R1013 Epigastric pain: Secondary | ICD-10-CM | POA: Diagnosis not present

## 2017-05-09 DIAGNOSIS — K5904 Chronic idiopathic constipation: Secondary | ICD-10-CM

## 2017-05-09 DIAGNOSIS — Z1211 Encounter for screening for malignant neoplasm of colon: Secondary | ICD-10-CM

## 2017-05-09 MED ORDER — DEXLANSOPRAZOLE 60 MG PO CPDR: 60.0000 mg | DELAYED_RELEASE_CAPSULE | Freq: Every day | ORAL | 0 refills | Status: DC

## 2017-05-09 NOTE — Patient Instructions (Addendum)
High-Fiber Diet Fiber, also called dietary fiber, is a type of carbohydrate found in fruits, vegetables, whole grains, and beans. A high-fiber diet can have many health benefits. Your health care provider may recommend a high-fiber diet to help:  Prevent constipation. Fiber can make your bowel movements more regular.  Lower your cholesterol.  Relieve hemorrhoids, uncomplicated diverticulosis, or irritable bowel syndrome.  Prevent overeating as part of a weight-loss plan.  Prevent heart disease, type 2 diabetes, and certain cancers.  What is my plan? The recommended daily intake of fiber includes:  38 grams for men under age 1.  4 grams for men over age 89.  35 grams for women under age 69.  82 grams for women over age 76.  You can get the recommended daily intake of dietary fiber by eating a variety of fruits, vegetables, grains, and beans. Your health care provider may also recommend a fiber supplement if it is not possible to get enough fiber through your diet. What do I need to know about a high-fiber diet?  Fiber supplements have not been widely studied for their effectiveness, so it is better to get fiber through food sources.  Always check the fiber content on thenutrition facts label of any prepackaged food. Look for foods that contain at least 5 grams of fiber per serving.  Ask your dietitian if you have questions about specific foods that are related to your condition, especially if those foods are not listed in the following section.  Increase your daily fiber consumption gradually. Increasing your intake of dietary fiber too quickly may cause bloating, cramping, or gas.  Drink plenty of water. Water helps you to digest fiber. What foods can I eat? Grains Whole-grain breads. Multigrain cereal. Oats and oatmeal. Brown rice. Barley. Bulgur wheat. [place]. Bran muffins. Popcorn. Rye wafer crackers. Vegetables Sweet potatoes. Spinach. Kale. Artichokes. Cabbage.  Broccoli. Green peas. Carrots. Squash. Fruits Berries. Pears. Apples. Oranges. Avocados. Prunes and raisins. Dried figs. Meats and Other Protein Sources Navy, kidney, pinto, and soy beans. Split peas. Lentils. Nuts and seeds. Dairy Fiber-fortified yogurt. Beverages Fiber-fortified soy milk. Fiber-fortified orange juice. Other Fiber bars. The items listed above may not be a complete list of recommended foods or beverages. Contact your dietitian for more options. What foods are not recommended? Grains White bread. Pasta made with refined flour. White rice. Vegetables Fried potatoes. Canned vegetables. Well-cooked vegetables. Fruits Fruit juice. Cooked, strained fruit. Meats and Other Protein Sources Fatty cuts of meat. Fried Sales executive or fried fish. Dairy Milk. Yogurt. Cream cheese. Sour cream. Beverages Soft drinks. Other Cakes and pastries. Butter and oils. The items listed above may not be a complete list of foods and beverages to avoid. Contact your dietitian for more information. What are some tips for including high-fiber foods in my diet?  Eat a wide variety of high-fiber foods.  Make sure that half of all grains consumed each day are whole grains.  Replace breads and cereals made from refined flour or white flour with whole-grain breads and cereals.  Replace white rice with brown rice, bulgur wheat, or millet.  Start the day with a breakfast that is high in fiber, such as a cereal that contains at least 5 grams of fiber per serving.  Use beans in place of meat in soups, salads, or pasta.  Eat high-fiber snacks, such as berries, raw vegetables, nuts, or popcorn. This information is not intended to replace advice given to you by your health care provider. Make sure you discuss any  questions you have with your health care provider. Document Released: 01/09/2005 Document Revised: 06/17/2015 Document Reviewed: 06/24/2013 Elsevier Interactive Patient Education  2018  [employer].   F/U 4 WEEKS

## 2017-05-09 NOTE — Progress Notes (Signed)
Cephas Darby, MD [address]  [place]  [place], [place] [address]  Main: [phone]  Fax: [phone]    Gastroenterology Consultation  Referring Provider:     Lenard Simmer, MD Primary Care Physician:  Lenard Simmer, MD Primary Gastroenterologist:  Dr. Cephas Darby Reason for Consultation:     Epigastric pain, GERD and chronic constipation        HPI:   Grace [name] is a 52 y.o. female referred by Dr. Ronnald Collum, Lourdes Sledge, MD  for consultation & management of chronic heartburn, chronic constipation and epigastric pain  Chronic heartburn: patient has been experiencing severe burning pain in the chest for a few years, almost on a daily basis. She used to take excellent which worked well, later switched to omeprazole 40 mg daily by her primary care physician. Her symptoms occur at night since switching to omeprazole. She denies difficulty swallowing. She wants to know if she can go back to Dexilant  Chronic constipation: she has infrequent bowel movements associated with significant straining and hard lumpy stool small amounts associated with incomplete emptying, always feels full, bloating. This has been ongoing for at least 1 year. Her life is mostly sedentary due to her job that involves lot of travel. She also gained weight significantly in last couple of years. she admits to eating high calorie foods and less fiber in her diet. She has not tried any stool softeners. TSH normal in 01/2014  Epigastric pain/right upper quadrant pain: Patient has been experiencing chronic epigastric pain and burning type. Last week, she went to emergency room because of severe attack of right upper quadrant pain after a fatty meal. Ultrasound and CT revealed a stone in the cystic duct and gallbladder. She saw Dr. Burt Knack, general surgeon and scheduled for cholecystectomy on 05/21/2017. She no longer has right upper quadrant pain. She had normal lipase, normal LFTs and there was no  evidence of choledocholithiasis or gallstone induced pancreatitis. She continues to have ongoing epigastric pain. She denies smoking. She consumes alcohol occasionally  NSAIDs: none  Antiplts/Anticoagulants/Anti thrombotics: none  GI Procedures: none She denies family history of GI malignancy  Past Medical History:  Diagnosis Date  . 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 Allergic rhinitis   . Asthma   . GERD (gastroesophageal reflux disease) 05/07/2012  . HYPOGLYCEMIA 11/08/2007   Qualifier: Diagnosis of  By: Marcelino Scot CMA, [place] Bil[place]    . Other chest pain 09/14/2009   Qualifier: Diagnosis of  By: Rockey Situ MD, Tim      Past Surgical History:  Procedure Laterality Date  . caesarean section    . htn     pulmonary edema with childbith, had cardiac w/ up  . SPIROMETRY  12/07   at allergist's- normal     Current Outpatient Medications:  .  albuterol (PROVENTIL HFA;VENTOLIN HFA) 108 (90 Base) MCG/ACT inhaler, Inhale 2 puffs into the lungs every 6 (six) hours as needed for wheezing or shortness of breath., Disp: 1 Inhaler, Rfl: 2 .  EPINEPHrine (EPIPEN 2-PAK) 0.3 mg/0.3 mL IJ SOAJ injection, , Disp: , Rfl:  .  furosemide (LASIX) 20 MG tablet, TAKE 1 TABLET BY MOUTH EVERY DAY IN THE MORNING, Disp: , Rfl: 5 .  mometasone (NASONEX) 50 MCG/ACT nasal spray, Place 2 sprays into the nose daily.  , Disp: , Rfl:  .  Multiple Vitamins-Minerals (MULTIVITAL) tablet, Take 1 tablet by mouth daily.  ,  Disp: , Rfl:  .  nitrofurantoin, macrocrystal-monohydrate, (MACROBID) 100 MG capsule, Take 1 capsule (100 mg total) by mouth 2 (two) times daily., Disp: 14 capsule, Rfl: 0 .  omeprazole (PRILOSEC) 40 MG capsule, Take by mouth., Disp: , Rfl:  .  Vitamin D, Ergocalciferol, (DRISDOL) 50000 UNITS CAPS, Take 50,000 Units by mouth every 7 (seven) days., Disp: , Rfl:  .  dexlansoprazole (DEXILANT) 60 MG capsule, Take 1 capsule (60 mg total) by mouth daily., Disp:  90 capsule, Rfl: 0   Family History  Problem Relation Age of Onset  . Hyperlipidemia Mother   . Hypertension Mother   . Hypertension Father   . Hyperlipidemia Father   . Breast cancer Maternal Aunt 51  . Breast cancer Maternal Grandmother 31     Social History   Tobacco Use  . Smoking status: Never Smoker  . Smokeless tobacco: Never Used  Substance Use Topics  . Alcohol use: Yes    Comment: occas  . Drug use: No    Allergies as of 05/09/2017 - Review Complete 05/09/2017  Allergen Reaction Noted  . Cephalexin    . Eggs or egg-derived products Itching 01/30/2014  . Gluten meal Swelling 01/30/2014  . Lactase Itching 01/30/2014  . Penicillins    . Tomato Swelling 01/30/2014  . Zyrtec [cetirizine]  03/12/2012    Review of Systems:    All systems reviewed and negative except where noted in HPI.   Physical Exam:  BP 115/73   Pulse 60   Temp 98.1 F (36.7 C) (Oral)   Ht 5\' 1"  (1.549 m)   Wt 219 lb 12.8 oz (99.7 kg)   BMI 41.53 kg/m  No LMP recorded. Patient is postmenopausal.  General:   Alert,  Well-developed, well-nourished, pleasant and cooperative in NAD Head:  Normocephalic and atraumatic. Eyes:  Sclera clear, no icterus.   Conjunctiva pink. Ears:  Normal auditory acuity. Nose:  No deformity, discharge, or lesions. Mouth:  No deformity or lesions,oropharynx pink & moist. Neck:  Supple; no masses or thyromegaly. Lungs:  Respirations even and unlabored.  Clear throughout to auscultation.   No wheezes, crackles, or rhonchi. No acute distress. Heart:  Regular rate and rhythm; no murmurs, clicks, rubs, or gallops. Abdomen:  Normal bowel sounds. Soft,obese, mild epigastric tenderness and distended with bloating, without masses, hepatosplenomegaly or hernias noted.  No guarding or rebound tenderness.   Rectal: Not performed Msk:  Symmetrical without gross deformities. Good, equal movement & strength bilaterally. Pulses:  Normal pulses noted. Extremities:  No  clubbing or edema.  No cyanosis. Neurologic:  Alert and oriented x3;  grossly normal neurologically. Skin:  Intact without significant lesions or rashes. No jaundice. Psych:  Alert and cooperative. Normal mood and affect.  Imaging Studies: reviewed  Assessment and Plan:   Grace [name] is a 52 y.o.Caucasian female with chronic GERD, chronic constipation and recent attack of biliary colic, cholelithiasis on CT and ultrasound. Her ongoing symptoms are most likely secondary to her lifestyle and eating habits  Chronic GERD: Switch to Dexilant 60 mg daily EGD Discussed with her about healthy lifestyle changes  Chronic constipation: Educated her about importance of high-fiber diet, incorporating at least 30 g of fiber in her diet Start fiber supplements Start linaclotide 145 MCG daily, samples given. Patient will contact my office if is working to send in a prescription  Epigastric pain: EGD for further evaluation  Biliary colic: Scheduled for cholecystectomy by Dr. Burt Knack  Colon cancer screening:  Recommend colonoscopy, Average risk  screening  I have discussed alternative options, risks & benefits,  which include, but are not limited to, bleeding, infection, perforation,respiratory complication & drug reaction.  The patient agrees with this plan & written consent will be obtained.     Follow up in 4 weeks   Cephas Darby, MD

## 2017-05-15 ENCOUNTER — Telehealth: Payer: Self-pay | Admitting: Surgery

## 2017-05-15 NOTE — Telephone Encounter (Signed)
Pt advised of pre op date/time and sx date. Sx: 05/21/17 with Dr Laqueta Carina chole.  Pre op: 05/17/17 between 9-1:00pm--phone interview.   Patient made aware to call [phone], between 1-3:00pm the day before surgery, to find out what time to arrive.

## 2017-05-16 ENCOUNTER — Encounter: Payer: Self-pay | Admitting: Certified Registered Nurse Anesthetist

## 2017-05-16 ENCOUNTER — Ambulatory Visit: Payer: BLUE CROSS/BLUE SHIELD | Admitting: Certified Registered Nurse Anesthetist

## 2017-05-16 ENCOUNTER — Encounter
Admission: RE | Disposition: A | Discharge status: Home / Self Care | Payer: Self-pay | Source: Ambulatory Visit | Attending: Gastroenterology

## 2017-05-16 ENCOUNTER — Ambulatory Visit
Admission: RE | Admit: 2017-05-16 | Discharge: 2017-05-16 | Disposition: A | Discharge status: Home / Self Care | Payer: BLUE CROSS/BLUE SHIELD | Source: Ambulatory Visit | Attending: Gastroenterology | Admitting: Gastroenterology

## 2017-05-16 DIAGNOSIS — K621 Rectal polyp: Secondary | ICD-10-CM | POA: Diagnosis not present

## 2017-05-16 DIAGNOSIS — Z79899 Other long term (current) drug therapy: Secondary | ICD-10-CM | POA: Diagnosis not present

## 2017-05-16 DIAGNOSIS — R1013 Epigastric pain: Secondary | ICD-10-CM | POA: Diagnosis not present

## 2017-05-16 DIAGNOSIS — Z7951 Long term (current) use of inhaled steroids: Secondary | ICD-10-CM | POA: Insufficient documentation

## 2017-05-16 DIAGNOSIS — J45909 Unspecified asthma, uncomplicated: Secondary | ICD-10-CM | POA: Diagnosis not present

## 2017-05-16 DIAGNOSIS — Z1211 Encounter for screening for malignant neoplasm of colon: Secondary | ICD-10-CM

## 2017-05-16 DIAGNOSIS — G8929 Other chronic pain: Secondary | ICD-10-CM

## 2017-05-16 DIAGNOSIS — K219 Gastro-esophageal reflux disease without esophagitis: Secondary | ICD-10-CM | POA: Insufficient documentation

## 2017-05-16 HISTORY — PX: ESOPHAGOGASTRODUODENOSCOPY (EGD) WITH PROPOFOL: SHX5813

## 2017-05-16 HISTORY — PX: COLONOSCOPY WITH PROPOFOL: SHX5780

## 2017-05-16 SURGERY — ESOPHAGOGASTRODUODENOSCOPY (EGD) WITH PROPOFOL
Anesthesia: General

## 2017-05-16 MED ORDER — LIDOCAINE HCL (PF) 1 % IJ SOLN
INTRAMUSCULAR | Status: AC
  Administered 2017-05-16: 0.3 mL via INTRADERMAL
  Filled 2017-05-16: qty 2

## 2017-05-16 MED ORDER — PROPOFOL 500 MG/50ML IV EMUL
INTRAVENOUS | Status: AC
  Filled 2017-05-16: qty 50

## 2017-05-16 MED ORDER — SODIUM CHLORIDE 0.9 % IV SOLN
INTRAVENOUS | Status: DC
  Administered 2017-05-16: 1000 mL via INTRAVENOUS

## 2017-05-16 MED ORDER — OMEPRAZOLE 20 MG PO CPDR: 20.0000 mg | DELAYED_RELEASE_CAPSULE | Freq: Two times a day (BID) | ORAL | 2 refills | Status: DC

## 2017-05-16 MED ORDER — LIDOCAINE HCL (CARDIAC) PF 100 MG/5ML IV SOSY
PREFILLED_SYRINGE | INTRAVENOUS | Status: DC | PRN
  Administered 2017-05-16: 100 mg via INTRAVENOUS

## 2017-05-16 MED ORDER — LIDOCAINE HCL (PF) 2 % IJ SOLN
INTRAMUSCULAR | Status: AC
  Filled 2017-05-16: qty 10

## 2017-05-16 MED ORDER — LIDOCAINE HCL (PF) 1 % IJ SOLN
2.0000 mL | Freq: Once | INTRAMUSCULAR | Status: AC
  Administered 2017-05-16: 0.3 mL via INTRADERMAL

## 2017-05-16 MED ORDER — PROPOFOL 10 MG/ML IV BOLUS
INTRAVENOUS | Status: DC | PRN
  Administered 2017-05-16: 90 mg via INTRAVENOUS

## 2017-05-16 MED ORDER — PROPOFOL 500 MG/50ML IV EMUL
INTRAVENOUS | Status: DC | PRN
  Administered 2017-05-16: 200 ug/kg/min via INTRAVENOUS

## 2017-05-16 NOTE — Op Note (Signed)
[facility] Gastroenterology Patient Name: Grace [name] Procedure Date: 05/16/2017 7:44 AM MRN: [id number] Account #: [id number] Date of Birth: 1965/08/13 Admit Type: Outpatient Age: 52 Room: [facility] ENDO ROOM 2 Gender: Female Note Status: Finalized Procedure:            Upper GI endoscopy Indications:          Epigastric abdominal pain, Heartburn Providers:            Lin Landsman MD, MD Referring MD:         Lenard Simmer, MD (Referring MD) Medicines:            Monitored Anesthesia Care Complications:        No immediate complications. Estimated blood loss:                        Minimal. Procedure:            Pre-Anesthesia Assessment:                       - Prior to the procedure, a History and Physical was                        performed, and patient medications and allergies were                        reviewed. The patient is competent. The risks and                        benefits of the procedure and the sedation options and                        risks were discussed with the patient. All questions                        were answered and informed consent was obtained.                        Patient identification and proposed procedure were                        verified by the physician, the nurse, the                        anesthesiologist, the anesthetist and the technician in                        the pre-procedure area in the procedure room in the                        endoscopy suite. Mental Status Examination: alert and                        oriented. Airway Examination: normal oropharyngeal                        airway and neck mobility. Respiratory Examination:                        clear to auscultation. CV Examination: normal.  Prophylactic Antibiotics: The patient does not require                        prophylactic antibiotics. Prior Anticoagulants: The                        patient has taken no  previous anticoagulant or                        antiplatelet agents. ASA Grade Assessment: II - A                        patient with mild systemic disease. After reviewing the                        risks and benefits, the patient was deemed in                        satisfactory condition to undergo the procedure. The                        anesthesia plan was to use monitored anesthesia care                        (MAC). Immediately prior to administration of                        medications, the patient was re-assessed for adequacy                        to receive sedatives. The heart rate, respiratory rate,                        oxygen saturations, blood pressure, adequacy of                        pulmonary ventilation, and response to care were                        monitored throughout the procedure. The physical status                        of the patient was re-assessed after the procedure.                       After obtaining informed consent, the endoscope was                        passed under direct vision. Throughout the procedure,                        the patient's blood pressure, pulse, and oxygen                        saturations were monitored continuously. The Endoscope                        was introduced through the mouth, and advanced to the  second part of duodenum. The upper GI endoscopy was                        accomplished without difficulty. The patient tolerated                        the procedure well. Findings:      The duodenal bulb and second portion of the duodenum were normal.      The entire examined stomach was normal. Biopsies were taken with a cold       forceps for Helicobacter pylori testing.      The cardia and gastric fundus were normal on retroflexion.      Esophagogastric landmarks were identified: the gastroesophageal junction       was found at 35 cm from the incisors.      The gastroesophageal junction  and examined esophagus were normal.      A few diminutive sessile polyps with no bleeding and no stigmata of       recent bleeding were found on the greater curvature of the gastric body       secondary to long term PPI use. Impression:           - Normal duodenal bulb and second portion of the                        duodenum.                       - Normal stomach. Biopsied.                       - Esophagogastric landmarks identified.                       - Normal gastroesophageal junction and esophagus. Recommendation:       - Await pathology results.                       - Use Prilosec (omeprazole) 20 mg PO BID.                       - Follow an antireflux regimen. Procedure Code(s):    --- Professional ---                       [phone], Esophagogastroduodenoscopy, flexible, transoral;                        with biopsy, single or multiple Diagnosis Code(s):    --- Professional ---                       R10.13, Epigastric pain                       R12, Heartburn CPT copyright 2017 American Medical Association. All rights reserved. The codes documented in this report are preliminary and upon coder review may  be revised to meet current compliance requirements. Dr. Ulyess Mort Lin Landsman MD, MD 05/16/2017 8:33:26 AM This report has been signed electronically. Number of Addenda: 0 Note Initiated On: 05/16/2017 7:44 AM      [facility]

## 2017-05-16 NOTE — H&P (Signed)
Cephas Darby, MD [address]  [place]  [place],  [address]  Main: [phone]  Fax: [phone] Pager: [phone]  Primary Care Physician:  Lenard Simmer, MD Primary Gastroenterologist:  Dr. Cephas Darby  Pre-Procedure History & Physical: HPI:  Grace [name] is a 52 y.o. female is here for an endoscopy and colonoscopy.   Past Medical History:  Diagnosis Date  . 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 Allergic rhinitis   . Asthma   . GERD (gastroesophageal reflux disease) 05/07/2012  . HYPOGLYCEMIA 11/08/2007   Qualifier: Diagnosis of  By: Marcelino Scot CMA, [place] Bilberry    . Other chest pain 09/14/2009   Qualifier: Diagnosis of  By: Rockey Situ MD, Tim      Past Surgical History:  Procedure Laterality Date  . caesarean section    . htn     pulmonary edema with childbith, had cardiac w/ up  . SPIROMETRY  12/07   at allergist's- normal    Prior to Admission medications   Medication Sig Start Date End Date Taking? Authorizing Provider  albuterol (PROVENTIL HFA;VENTOLIN HFA) 108 (90 Base) MCG/ACT inhaler Inhale 2 puffs into the lungs every 6 (six) hours as needed for wheezing or shortness of breath. 02/26/17   Duanne Guess, PA-C  EPINEPHrine (EPIPEN 2-PAK) 0.3 mg/0.3 mL IJ SOAJ injection Inject 0.3 mg into the muscle once as needed.  11/14/13   [provider]  furosemide (LASIX) 20 MG tablet TAKE 1 TABLET BY MOUTH EVERY DAY IN THE MORNING 04/16/17   [provider]  mometasone (NASONEX) 50 MCG/ACT nasal spray Place 2 sprays into the nose daily as needed.     [provider]  Multiple Vitamins-Minerals (MULTIVITAL) tablet Take 1 tablet by mouth daily.      [provider]  omeprazole (PRILOSEC) 40 MG capsule Take 40 mg by mouth daily.     [provider]  Vitamin D, Ergocalciferol, (DRISDOL) 50000 UNITS CAPS Take 50,000 Units by mouth every 7 (seven) days.     [provider]    Allergies as of 05/09/2017 - Review Complete 05/09/2017  Allergen Reaction Noted  . Cephalexin    . Eggs or egg-derived products Itching 01/30/2014  . Gluten meal Swelling 01/30/2014  . Lactase Itching 01/30/2014  . Penicillins    . Tomato Swelling 01/30/2014  . Zyrtec [cetirizine]  03/12/2012    Family History  Problem Relation Age of Onset  . Hyperlipidemia Mother   . Hypertension Mother   . Hypertension Father   . Hyperlipidemia Father   . Breast cancer Maternal Aunt 51  . Breast cancer Maternal Grandmother 56    Social History   Socioeconomic History  . Marital status: Married    Spouse name: Not on file  . Number of children: Not on file  . Years of education: Not on file  . Highest education level: Not on file  Occupational History  . Not on file  Social Needs  . Financial resource strain: Not on file  . Food insecurity:    Worry: Not on file    Inability: Not on file  . Transportation needs:    Medical: Not on file    Non-medical: Not on file  Tobacco Use  . Smoking status: Never Smoker  . Smokeless tobacco: Never Used  Substance and Sexual Activity  . Alcohol use: Yes    Comment: occas  . Drug use:  No  . Sexual activity: Not on file  Lifestyle  . Physical activity:    Days per week: Not on file    Minutes per session: Not on file  . Stress: Not on file  Relationships  . Social connections:    Talks on phone: Not on file    Gets together: Not on file    Attends religious service: Not on file    Active member of club or organization: Not on file    Attends meetings of clubs or organizations: Not on file    Relationship status: Not on file  . Intimate partner violence:    Fear of current or ex partner: Not on file    Emotionally abused: Not on file    Physically abused: Not on file    Forced sexual activity: Not on file  Other Topics Concern  . Not on file  Social History Narrative  . Not on file    Review of  Systems: See HPI, otherwise negative ROS  Physical Exam: BP 105/63   Pulse 69   Temp (!) 96.6 F (35.9 C) (Tympanic)   Resp 17   Ht 5\' 1"  (1.549 m)   Wt 219 lb (99.3 kg)   SpO2 100%   BMI 41.38 kg/m  General:   Alert,  pleasant and cooperative in NAD Head:  Normocephalic and atraumatic. Neck:  Supple; no masses or thyromegaly. Lungs:  Clear throughout to auscultation.    Heart:  Regular rate and rhythm. Abdomen:  Soft, nontender and nondistended. Normal bowel sounds, without guarding, and without rebound.   Neurologic:  Alert and  oriented x4;  grossly normal neurologically.  Impression/Plan: Grace [name] is here for an endoscopy and colonoscopy to be performed for epigastric pain and colon cancer screening  Risks, benefits, limitations, and alternatives regarding  endoscopy and colonoscopy have been reviewed with the patient.  Questions have been answered.  All parties agreeable.   Sherri Sear, MD  05/16/2017, 8:19 AM

## 2017-05-16 NOTE — Anesthesia Postprocedure Evaluation (Signed)
Anesthesia Post Note  Patient: Grace [name]  Procedure(s) Performed: ESOPHAGOGASTRODUODENOSCOPY (EGD) WITH PROPOFOL (N/A ) COLONOSCOPY WITH PROPOFOL (N/A )  Patient location during evaluation: Endoscopy Anesthesia Type: General Level of consciousness: awake and alert and oriented Pain management: pain level controlled Vital Signs Assessment: post-procedure vital signs reviewed and stable Respiratory status: spontaneous breathing, nonlabored ventilation and respiratory function stable Cardiovascular status: blood pressure returned to baseline and stable Postop Assessment: no signs of nausea or vomiting Anesthetic complications: no     Last Vitals:  Vitals:   05/16/17 0910 05/16/17 0913  BP:  (!) 105/59  Pulse: 62 63  Resp: 13 (!) 25  Temp:    SpO2: 98% 99%    Last Pain:  Vitals:   05/16/17 0852  TempSrc: Tympanic  PainSc:                  Merl Bommarito

## 2017-05-16 NOTE — Transfer of Care (Signed)
Immediate Anesthesia Transfer of Care Note  Patient: Grace [name]  Procedure(s) Performed: ESOPHAGOGASTRODUODENOSCOPY (EGD) WITH PROPOFOL (N/A ) COLONOSCOPY WITH PROPOFOL (N/A )  Patient Location: PACU and Endoscopy Unit  Anesthesia Type:General  Level of Consciousness: drowsy  Airway & Oxygen Therapy: Patient Spontanous Breathing  Post-op Assessment: Report given to RN  Post vital signs: stable  Last Vitals:  Vitals Value Taken Time  BP    Temp 36 C 05/16/2017  8:52 AM  Pulse 65 05/16/2017  8:52 AM  Resp 14 05/16/2017  8:52 AM  SpO2 97 % 05/16/2017  8:52 AM  Vitals shown include unvalidated device data.  Last Pain:  Vitals:   05/16/17 0852  TempSrc: Tympanic  PainSc:          Complications: No apparent anesthesia complications

## 2017-05-16 NOTE — Anesthesia Post-op Follow-up Note (Signed)
Anesthesia QCDR form completed.        

## 2017-05-16 NOTE — Anesthesia Preprocedure Evaluation (Signed)
Anesthesia Evaluation  Patient identified by MRN, date of birth, ID band Patient awake    Reviewed: Allergy & Precautions, NPO status , Patient's Chart, lab work & pertinent test results  History of Anesthesia Complications Negative for: history of anesthetic complications  Airway Mallampati: III  TM Distance: >3 FB Neck ROM: Full    Dental no notable dental hx.    Pulmonary asthma , neg sleep apnea,    breath sounds clear to auscultation- rhonchi (-) wheezing      Cardiovascular Exercise Tolerance: Good (-) hypertension(-) CAD, (-) Past MI and (-) Cardiac Stents  Rhythm:Regular Rate:Normal - Systolic murmurs and - Diastolic murmurs    Neuro/Psych negative neurological ROS  negative psych ROS   GI/Hepatic Neg liver ROS, GERD  ,  Endo/Other  negative endocrine ROSneg diabetes  Renal/GU negative Renal ROS     Musculoskeletal negative musculoskeletal ROS (+)   Abdominal (+) + obese,   Peds  Hematology negative hematology ROS (+)   Anesthesia Other Findings Past Medical History: 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     Comment:  Qualifier: Diagnosis of  By: Marcelino Scot CMA, [place] Bilberry   No date: Allergic rhinitis No date: Asthma 05/07/2012: GERD (gastroesophageal reflux disease) 11/08/2007: HYPOGLYCEMIA     Comment:  Qualifier: Diagnosis of  By: Marcelino Scot CMA, [place] Bilberry   09/14/2009: Other chest pain     Comment:  Qualifier: Diagnosis of  By: Rockey Situ MD, Tim     Reproductive/Obstetrics                             Anesthesia Physical Anesthesia Plan  ASA: II  Anesthesia Plan: General   Post-op Pain Management:    Induction: Intravenous  PONV Risk Score and Plan: 2 and Propofol infusion  Airway Management Planned: Natural Airway  Additional Equipment:   Intra-op Plan:   Post-operative Plan:   Informed Consent: I have reviewed the patients History and Physical, chart, labs and discussed  the procedure including the risks, benefits and alternatives for the proposed anesthesia with the patient or authorized representative who has indicated his/her understanding and acceptance.   Dental advisory given  Plan Discussed with: CRNA and Anesthesiologist  Anesthesia Plan Comments:         Anesthesia Quick Evaluation

## 2017-05-16 NOTE — Op Note (Signed)
[facility] Gastroenterology Patient Name: Grace [name] Procedure Date: 05/16/2017 7:44 AM MRN: [id number] Account #: [id number] Date of Birth: 04-Apr-1965 Admit Type: Outpatient Age: 52 Room: [facility] ENDO ROOM 2 Gender: Female Note Status: Finalized Procedure:            Colonoscopy Indications:          Screening for colorectal malignant neoplasm, This is                        the patient's first colonoscopy Providers:            Lin Landsman MD, MD Referring MD:         Lenard Simmer, MD (Referring MD) Medicines:            Monitored Anesthesia Care Complications:        No immediate complications. Estimated blood loss: None. Procedure:            Pre-Anesthesia Assessment:                       - Prior to the procedure, a History and Physical was                        performed, and patient medications and allergies were                        reviewed. The patient is competent. The risks and                        benefits of the procedure and the sedation options and                        risks were discussed with the patient. All questions                        were answered and informed consent was obtained.                        Patient identification and proposed procedure were                        verified by the physician, the nurse, the                        anesthesiologist, the anesthetist and the technician in                        the pre-procedure area in the procedure room in the                        endoscopy suite. Mental Status Examination: alert and                        oriented. Airway Examination: normal oropharyngeal                        airway and neck mobility. Respiratory Examination:                        clear to auscultation. CV Examination: normal.  Prophylactic Antibiotics: The patient does not require                        prophylactic antibiotics. Prior Anticoagulants: The                 patient has taken no previous anticoagulant or                        antiplatelet agents. ASA Grade Assessment: II - A                        patient with mild systemic disease. After reviewing the                        risks and benefits, the patient was deemed in                        satisfactory condition to undergo the procedure. The                        anesthesia plan was to use monitored anesthesia care                        (MAC). Immediately prior to administration of                        medications, the patient was re-assessed for adequacy                        to receive sedatives. The heart rate, respiratory rate,                        oxygen saturations, blood pressure, adequacy of                        pulmonary ventilation, and response to care were                        monitored throughout the procedure. The physical status                        of the patient was re-assessed after the procedure.                       After obtaining informed consent, the colonoscope was                        passed under direct vision. Throughout the procedure,                        the patient's blood pressure, pulse, and oxygen                        saturations were monitored continuously. The                        Colonoscope was introduced through the anus and                        advanced to the the cecum, identified by appendiceal  orifice and ileocecal valve. The colonoscopy was                        performed without difficulty. The patient tolerated the                        procedure well. The quality of the bowel preparation                        was evaluated using the BBPS [facility] Bowel Preparation                        Scale) with scores of: Right Colon = 3, Transverse                        Colon = 3 and Left Colon = 3 (entire mucosa seen well                        with no residual staining, small fragments of  stool or                        opaque liquid). The total BBPS score equals 9. Findings:      The perianal and digital rectal examinations were normal. Pertinent       negatives include normal sphincter tone and no palpable rectal lesions.      A 5 mm polyp was found in the rectum. The polyp was sessile. The polyp       was removed with a cold snare. Resection and retrieval were complete.      The colon (entire examined portion) appeared normal.      The retroflexed view of the distal rectum and anal verge was normal and       showed no anal or rectal abnormalities. Impression:           - One 5 mm polyp in the rectum, removed with a cold                        snare. Resected and retrieved.                       - The entire examined colon is normal.                       - The distal rectum and anal verge are normal on                        retroflexion view. Recommendation:       - Discharge patient to home.                       - Resume previous diet today.                       - Continue present medications.                       - Await pathology results.                       - Repeat colonoscopy in 5 years for surveillance. Procedure Code(s):    ---  Professional ---                       [phone], Colonoscopy, flexible; with removal of tumor(s),                        polyp(s), or other lesion(s) by snare technique Diagnosis Code(s):    --- Professional ---                       Z12.11, Encounter for screening for malignant neoplasm                        of colon                       K62.1, Rectal polyp CPT copyright 2017 American Medical Association. All rights reserved. The codes documented in this report are preliminary and upon coder review may  be revised to meet current compliance requirements. Dr. Ulyess Mort Lin Landsman MD, MD 05/16/2017 8:50:11 AM This report has been signed electronically. Number of Addenda: 0 Note Initiated On: 05/16/2017 7:44 AM Scope  Withdrawal Time: 0 hours 9 minutes 17 seconds  Total Procedure Duration: 0 hours 12 minutes 5 seconds       [facility]

## 2017-05-17 ENCOUNTER — Encounter
Admission: RE | Admit: 2017-05-17 | Discharge: 2017-05-17 | Disposition: A | Discharge status: Home / Self Care | Payer: BLUE CROSS/BLUE SHIELD | Source: Ambulatory Visit | Attending: Surgery | Admitting: Surgery

## 2017-05-17 ENCOUNTER — Other Ambulatory Visit: Payer: Self-pay

## 2017-05-17 HISTORY — DX: Peripartum cardiomyopathy: O90.3

## 2017-05-17 HISTORY — DX: Bronchitis, not specified as acute or chronic: J40

## 2017-05-17 LAB — SURGICAL PATHOLOGY

## 2017-05-17 NOTE — Patient Instructions (Signed)
Your procedure is scheduled on: 05-21-17  Report to Same Day Surgery 2nd floor medical mall [facility] Entrance-take elevator on left to 2nd floor.  Check in with surgery information desk.) To find out your arrival time please call [phone] between 1PM - 3PM on 05-18-17  Remember: Instructions that are not followed completely may result in serious medical risk, up to and including death, or upon the discretion of your surgeon and anesthesiologist your surgery may need to be rescheduled.    _x___ 1. Do not eat food after midnight the night before your procedure. NO GUM OR CANDY AFTER MIDNIGHT.  You may drink clear liquids up to 2 hours before you are scheduled to arrive at the hospital for your procedure.  Do not drink clear liquids within 2 hours of your scheduled arrival to the hospital.  Clear liquids include  --Water or Apple juice without pulp  --Clear carbohydrate beverage such as ClearFast or Gatorade  --Black Coffee or Clear Tea (No milk, no creamers, do not add anything to the coffee or Tea     __x__ 2. No Alcohol for 24 hours before or after surgery.   __x__3. No Smoking or e-cigarettes for 24 prior to surgery.  Do not use any chewable tobacco products for at least 6 hour prior to surgery   ____  4. Bring all medications with you on the day of surgery if instructed.    __x__ 5. Notify your doctor if there is any change in your medical condition     (cold, fever, infections).    x___6. On the morning of surgery brush your teeth with toothpaste and water.  You may rinse your mouth with mouth wash if you wish.  Do not swallow any toothpaste or mouthwash.   Do not wear jewelry, make-up, hairpins, clips or nail polish.  Do not wear lotions, powders, or perfumes. You may wear deodorant.  Do not shave 48 hours prior to surgery. Men may shave face and neck.  Do not bring valuables to the hospital.    [facility] is not responsible for any belongings or valuables.     Contacts, dentures or bridgework may not be worn into surgery.  Leave your suitcase in the car. After surgery it may be brought to your room.  For patients admitted to the hospital, discharge time is determined by your treatment team.  _  Patients discharged the day of surgery will not be allowed to drive home.  You will need someone to drive you home and stay with you the night of your procedure.     _x___ TAKE THE FOLLOWING MEDICATION THE MORNING OF SURGERY. These include:  1. PRILOSEC  2. TAKE AN EXTRA PRILOSEC Sunday NIGHT BEFORE BED  3. YOU MAY TAKE PERCOCET DAY OF SURGERY IF NEEDED WITH SMALL SIP OF WATER  4.  5.  6.  ____Fleets enema or Magnesium Citrate as directed.   ____ Use CHG Soap or sage wipes as directed on instruction sheet   ____ Use inhalers on the day of surgery and bring to hospital day of surgery  ____ Stop Metformin and Janumet 2 days prior to surgery.    ____ Take 1/2 of usual insulin dose the night before surgery and none on the morning surgery.   ____ Follow recommendations from Cardiologist, Pulmonologist or PCP regarding          stopping Aspirin, Coumadin, Plavix ,Eliquis, Effient, or Pradaxa, and Pletal.  X____Stop Anti-inflammatories such as Advil, Aleve, Ibuprofen, Motrin,  Naproxen, Naprosyn, Goodies powders or aspirin products NOW-OK to take Tylenol OR PERCOCET   ____ Stop supplements until after surgery.     ____ Bring C-Pap to the hospital.

## 2017-05-18 ENCOUNTER — Encounter: Payer: Self-pay | Admitting: Gastroenterology

## 2017-05-20 MED ORDER — CIPROFLOXACIN IN D5W 400 MG/200ML IV SOLN
400.0000 mg | INTRAVENOUS | Status: AC
  Administered 2017-05-21: 400 mg via INTRAVENOUS

## 2017-05-21 ENCOUNTER — Other Ambulatory Visit: Payer: Self-pay

## 2017-05-21 ENCOUNTER — Ambulatory Visit: Payer: BLUE CROSS/BLUE SHIELD | Admitting: Anesthesiology

## 2017-05-21 ENCOUNTER — Encounter
Admission: RE | Disposition: A | Discharge status: Home / Self Care | Payer: Self-pay | Source: Ambulatory Visit | Attending: Surgery

## 2017-05-21 ENCOUNTER — Ambulatory Visit
Admission: RE | Admit: 2017-05-21 | Discharge: 2017-05-21 | Disposition: A | Discharge status: Home / Self Care | Payer: BLUE CROSS/BLUE SHIELD | Source: Ambulatory Visit | Attending: Surgery | Admitting: Surgery

## 2017-05-21 ENCOUNTER — Encounter: Payer: Self-pay | Admitting: *Deleted

## 2017-05-21 DIAGNOSIS — J45909 Unspecified asthma, uncomplicated: Secondary | ICD-10-CM | POA: Diagnosis not present

## 2017-05-21 DIAGNOSIS — Z79899 Other long term (current) drug therapy: Secondary | ICD-10-CM | POA: Insufficient documentation

## 2017-05-21 DIAGNOSIS — L700 Acne vulgaris: Secondary | ICD-10-CM | POA: Insufficient documentation

## 2017-05-21 DIAGNOSIS — K8064 Calculus of gallbladder and bile duct with chronic cholecystitis without obstruction: Secondary | ICD-10-CM | POA: Diagnosis present

## 2017-05-21 DIAGNOSIS — K8050 Calculus of bile duct without cholangitis or cholecystitis without obstruction: Secondary | ICD-10-CM | POA: Diagnosis not present

## 2017-05-21 DIAGNOSIS — K219 Gastro-esophageal reflux disease without esophagitis: Secondary | ICD-10-CM | POA: Diagnosis not present

## 2017-05-21 HISTORY — PX: CHOLECYSTECTOMY: SHX55

## 2017-05-21 SURGERY — LAPAROSCOPIC CHOLECYSTECTOMY
Anesthesia: General

## 2017-05-21 MED ORDER — FENTANYL CITRATE (PF) 100 MCG/2ML IJ SOLN
INTRAMUSCULAR | Status: AC
  Administered 2017-05-21: 25 ug via INTRAVENOUS
  Filled 2017-05-21: qty 2

## 2017-05-21 MED ORDER — LIDOCAINE HCL (PF) 2 % IJ SOLN
INTRAMUSCULAR | Status: AC
  Filled 2017-05-21: qty 10

## 2017-05-21 MED ORDER — KETOROLAC TROMETHAMINE 30 MG/ML IJ SOLN
INTRAMUSCULAR | Status: DC | PRN
  Administered 2017-05-21: 30 mg via INTRAVENOUS

## 2017-05-21 MED ORDER — BUPIVACAINE-EPINEPHRINE (PF) 0.25% -1:200000 IJ SOLN
INTRAMUSCULAR | Status: AC
  Filled 2017-05-21: qty 30

## 2017-05-21 MED ORDER — MIDAZOLAM HCL 2 MG/2ML IJ SOLN
INTRAMUSCULAR | Status: AC
  Filled 2017-05-21: qty 4

## 2017-05-21 MED ORDER — HYDROMORPHONE HCL 1 MG/ML IJ SOLN
INTRAMUSCULAR | Status: DC | PRN
  Administered 2017-05-21: 1 mg via INTRAVENOUS

## 2017-05-21 MED ORDER — SODIUM CHLORIDE 0.9 % IJ SOLN
INTRAMUSCULAR | Status: AC
  Filled 2017-05-21: qty 10

## 2017-05-21 MED ORDER — KETOROLAC TROMETHAMINE 30 MG/ML IJ SOLN
INTRAMUSCULAR | Status: AC
  Filled 2017-05-21: qty 1

## 2017-05-21 MED ORDER — PROMETHAZINE HCL 25 MG/ML IJ SOLN
INTRAMUSCULAR | Status: AC
  Filled 2017-05-21: qty 1

## 2017-05-21 MED ORDER — DEXAMETHASONE SODIUM PHOSPHATE 10 MG/ML IJ SOLN
INTRAMUSCULAR | Status: DC | PRN
  Administered 2017-05-21: 10 mg via INTRAVENOUS

## 2017-05-21 MED ORDER — ONDANSETRON HCL 4 MG/2ML IJ SOLN
INTRAMUSCULAR | Status: AC
  Filled 2017-05-21: qty 2

## 2017-05-21 MED ORDER — ROCURONIUM BROMIDE 100 MG/10ML IV SOLN
INTRAVENOUS | Status: DC | PRN
  Administered 2017-05-21: 50 mg via INTRAVENOUS

## 2017-05-21 MED ORDER — ONDANSETRON HCL 4 MG/2ML IJ SOLN
INTRAMUSCULAR | Status: DC | PRN
  Administered 2017-05-21: 4 mg via INTRAVENOUS

## 2017-05-21 MED ORDER — GLYCOPYRROLATE 0.2 MG/ML IJ SOLN
INTRAMUSCULAR | Status: DC | PRN
  Administered 2017-05-21: 0.1 mg via INTRAVENOUS

## 2017-05-21 MED ORDER — BUPIVACAINE-EPINEPHRINE (PF) 0.25% -1:200000 IJ SOLN
INTRAMUSCULAR | Status: DC | PRN
  Administered 2017-05-21: 30 mL

## 2017-05-21 MED ORDER — LACTATED RINGERS IV SOLN
INTRAVENOUS | Status: DC
  Administered 2017-05-21: 09:00:00 via INTRAVENOUS

## 2017-05-21 MED ORDER — CIPROFLOXACIN IN D5W 400 MG/200ML IV SOLN
INTRAVENOUS | Status: AC
  Filled 2017-05-21: qty 200

## 2017-05-21 MED ORDER — CHLORHEXIDINE GLUCONATE CLOTH 2 % EX PADS: 6.0000 | MEDICATED_PAD | Freq: Once | CUTANEOUS | Status: DC

## 2017-05-21 MED ORDER — HEPARIN SODIUM (PORCINE) 5000 UNIT/ML IJ SOLN
INTRAMUSCULAR | Status: AC
  Filled 2017-05-21: qty 1

## 2017-05-21 MED ORDER — LIDOCAINE HCL (CARDIAC) PF 100 MG/5ML IV SOSY
PREFILLED_SYRINGE | INTRAVENOUS | Status: DC | PRN
  Administered 2017-05-21: 50 mg via INTRAVENOUS

## 2017-05-21 MED ORDER — ACETAMINOPHEN 10 MG/ML IV SOLN
INTRAVENOUS | Status: DC | PRN
  Administered 2017-05-21: 1000 mg via INTRAVENOUS

## 2017-05-21 MED ORDER — SUGAMMADEX SODIUM 200 MG/2ML IV SOLN
INTRAVENOUS | Status: AC
  Filled 2017-05-21: qty 2

## 2017-05-21 MED ORDER — HYDROMORPHONE HCL 1 MG/ML IJ SOLN
INTRAMUSCULAR | Status: AC
  Filled 2017-05-21: qty 1

## 2017-05-21 MED ORDER — ROCURONIUM BROMIDE 50 MG/5ML IV SOLN
INTRAVENOUS | Status: AC
  Filled 2017-05-21: qty 1

## 2017-05-21 MED ORDER — MIDAZOLAM HCL 2 MG/2ML IJ SOLN
INTRAMUSCULAR | Status: DC | PRN
  Administered 2017-05-21: 4 mg via INTRAVENOUS

## 2017-05-21 MED ORDER — DEXAMETHASONE SODIUM PHOSPHATE 10 MG/ML IJ SOLN
INTRAMUSCULAR | Status: AC
Start: 2017-05-21 — End: 2017-05-21
  Filled 2017-05-21: qty 1

## 2017-05-21 MED ORDER — PROPOFOL 10 MG/ML IV BOLUS
INTRAVENOUS | Status: DC | PRN
  Administered 2017-05-21: 150 mg via INTRAVENOUS

## 2017-05-21 MED ORDER — SUGAMMADEX SODIUM 200 MG/2ML IV SOLN
INTRAVENOUS | Status: DC | PRN
  Administered 2017-05-21: 200 mg via INTRAVENOUS

## 2017-05-21 MED ORDER — FENTANYL CITRATE (PF) 100 MCG/2ML IJ SOLN
25.0000 ug | INTRAMUSCULAR | Status: DC | PRN
  Administered 2017-05-21 (×2): 25 ug via INTRAVENOUS

## 2017-05-21 MED ORDER — PROMETHAZINE HCL 25 MG/ML IJ SOLN
6.2500 mg | INTRAMUSCULAR | Status: DC | PRN
  Administered 2017-05-21: 6.25 mg via INTRAVENOUS

## 2017-05-21 MED ORDER — ACETAMINOPHEN 10 MG/ML IV SOLN
INTRAVENOUS | Status: AC
  Filled 2017-05-21: qty 100

## 2017-05-21 MED ORDER — HEPARIN SODIUM (PORCINE) 5000 UNIT/ML IJ SOLN
5000.0000 [IU] | Freq: Once | INTRAMUSCULAR | Status: AC
  Administered 2017-05-21: 5000 [IU] via SUBCUTANEOUS

## 2017-05-21 MED ORDER — PROPOFOL 10 MG/ML IV BOLUS
INTRAVENOUS | Status: AC
  Filled 2017-05-21: qty 20

## 2017-05-21 MED ORDER — GLYCOPYRROLATE 0.2 MG/ML IJ SOLN
INTRAMUSCULAR | Status: AC
Start: 2017-05-21 — End: 2017-05-21
  Filled 2017-05-21: qty 1

## 2017-05-21 SURGICAL SUPPLY — 44 items
ADH LQ OCL WTPRF AMP STRL LF (MISCELLANEOUS) ×1
ADHESIVE MASTISOL STRL (MISCELLANEOUS) ×2 IMPLANT
APPLIER CLIP ROT 10 11.4 M/L (STAPLE) ×2
APR CLP MED LRG 11.4X10 (STAPLE) ×1
BAG SPEC RTRVL LRG 6X4 10 (ENDOMECHANICALS) ×1
BLADE SURG SZ11 CARB STEEL (BLADE) ×2 IMPLANT
CANISTER SUCT 1200ML W/VALVE (MISCELLANEOUS) ×2 IMPLANT
CATH CHOLANGI 4FR 420404F (CATHETERS) IMPLANT
CHLORAPREP W/TINT 26ML (MISCELLANEOUS) ×2 IMPLANT
CLIP APPLIE ROT 10 11.4 M/L (STAPLE) ×1 IMPLANT
CONRAY 60ML FOR OR (MISCELLANEOUS) IMPLANT
DRAPE C-ARM XRAY 36X54 (DRAPES) IMPLANT
ELECT REM PT RETURN 9FT ADLT (ELECTROSURGICAL) ×2
ELECTRODE REM PT RTRN 9FT ADLT (ELECTROSURGICAL) ×1 IMPLANT
GLOVE BIO SURGEON STRL SZ8 (GLOVE) ×2 IMPLANT
GOWN STRL REUS W/ TWL LRG LVL3 (GOWN DISPOSABLE) ×4 IMPLANT
GOWN STRL REUS W/TWL LRG LVL3 (GOWN DISPOSABLE) ×8
IRRIGATION STRYKERFLOW (MISCELLANEOUS) ×1 IMPLANT
IRRIGATOR STRYKERFLOW (MISCELLANEOUS) ×2
IV CATH ANGIO 12GX3 LT BLUE (NEEDLE) ×1 IMPLANT
IV NS 1000ML (IV SOLUTION) ×2
IV NS 1000ML BAXH (IV SOLUTION) ×1 IMPLANT
JACKSON PRATT 10 (INSTRUMENTS) IMPLANT
KIT TURNOVER KIT A (KITS) ×2 IMPLANT
LABEL OR SOLS (LABEL) ×1 IMPLANT
NEEDLE HYPO 22GX1.5 SAFETY (NEEDLE) ×2 IMPLANT
NEEDLE VERESS 14GA 120MM (NEEDLE) ×2 IMPLANT
NS IRRIG 500ML POUR BTL (IV SOLUTION) ×2 IMPLANT
PACK LAP CHOLECYSTECTOMY (MISCELLANEOUS) ×2 IMPLANT
POUCH SPECIMEN RETRIEVAL 10MM (ENDOMECHANICALS) ×2 IMPLANT
SCISSORS METZENBAUM CVD 33 (INSTRUMENTS) ×1 IMPLANT
SLEEVE ENDOPATH XCEL 5M (ENDOMECHANICALS) ×4 IMPLANT
SPONGE GAUZE 2X2 8PLY STRL LF (GAUZE/BANDAGES/DRESSINGS) ×8 IMPLANT
SPONGE LAP 18X18 5 PK (GAUZE/BANDAGES/DRESSINGS) ×2 IMPLANT
SPONGE VERSALON 4X4 4PLY (MISCELLANEOUS) IMPLANT
STRIP CLOSURE SKIN 1/2X4 (GAUZE/BANDAGES/DRESSINGS) ×2 IMPLANT
SUT MNCRL 4-0 (SUTURE) ×2
SUT MNCRL 4-0 27XMFL (SUTURE) ×1
SUT VICRYL 0 AB UR-6 (SUTURE) ×2 IMPLANT
SUTURE MNCRL 4-0 27XMF (SUTURE) ×1 IMPLANT
SYR 20CC LL (SYRINGE) ×2 IMPLANT
TROCAR XCEL NON-BLD 11X100MML (ENDOMECHANICALS) ×2 IMPLANT
TROCAR XCEL NON-BLD 5MMX100MML (ENDOMECHANICALS) ×2 IMPLANT
TUBING INSUFFLATION (TUBING) ×2 IMPLANT

## 2017-05-21 NOTE — Discharge Instructions (Addendum)
Remove dressing in 24 hours. °May shower in 24 hours. °Leave paper strips in place. °Resume all home medications. °Follow-up with Dr. Cooper in 10 days. ° °AMBULATORY SURGERY  °DISCHARGE INSTRUCTIONS ° ° °1) The drugs that you were given will stay in your system until tomorrow so for the next 24 hours you should not: ° °A) Drive an automobile °B) Make any legal decisions °C) Drink any alcoholic beverage ° ° °2) You may resume regular meals tomorrow.  Today it is better to start with liquids and gradually work up to solid foods. ° °You may eat anything you prefer, but it is better to start with liquids, then soup and crackers, and gradually work up to solid foods. ° ° °3) Please notify your doctor immediately if you have any unusual bleeding, trouble breathing, redness and pain at the surgery site, drainage, fever, or pain not relieved by medication. ° ° ° °4) Additional Instructions: ° ° ° ° ° ° ° °Please contact your physician with any problems or Same Day Surgery at [phone], Monday through Friday 6 am to 4 pm, or [facility] [place] Main number at [phone]. °

## 2017-05-21 NOTE — Anesthesia Postprocedure Evaluation (Signed)
Anesthesia Post Note  Patient: Grace [name]  Procedure(s) Performed: LAPAROSCOPIC CHOLECYSTECTOMY (N/A )  Patient location during evaluation: PACU Anesthesia Type: General Level of consciousness: awake and alert Pain management: pain level controlled Vital Signs Assessment: post-procedure vital signs reviewed and stable Respiratory status: spontaneous breathing, nonlabored ventilation, respiratory function stable and patient connected to nasal cannula oxygen Cardiovascular status: blood pressure returned to baseline and stable Postop Assessment: no apparent nausea or vomiting Anesthetic complications: no     Last Vitals:  Vitals:   05/21/17 1202 05/21/17 1236  BP: 118/60 (!) 104/50  Pulse: (!) 51 (!) 56  Resp: 14 16  Temp: 36.6 C 36.6 C  SpO2: 96% 98%    Last Pain:  Vitals:   05/21/17 1400  TempSrc:   PainSc: 0-No pain                 Martha Clan

## 2017-05-21 NOTE — Op Note (Signed)
Laparoscopic Cholecystectomy  Pre-operative Diagnosis: Biliary colic  Post-operative Diagnosis: Biliary colic  Procedure: Laparoscopic cholecystectomy  Surgeon: Jerrol Banana. Burt Knack, MD FACS  Anesthesia: Gen. with endotracheal tube  Assistant: PA student  Procedure Details  The patient was seen again in the Holding Room. The benefits, complications, treatment options, and expected outcomes were discussed with the patient. The risks of bleeding, infection, recurrence of symptoms, failure to resolve symptoms, bile duct damage, bile duct leak, retained common bile duct stone, bowel injury, any of which could require further surgery and/or ERCP, stent, or papillotomy were reviewed with the patient. The likelihood of improving the patient's symptoms with return to their baseline status is good.  The patient and/or family concurred with the proposed plan, giving informed consent.  The patient was taken to Operating Room, identified as Grace [name] and the procedure verified as Laparoscopic Cholecystectomy.  A Time Out was held and the above information confirmed.  Prior to the induction of general anesthesia, antibiotic prophylaxis was administered. VTE prophylaxis was in place. General endotracheal anesthesia was then administered and tolerated well. After the induction, the abdomen was prepped with Chloraprep and draped in the sterile fashion. The patient was positioned in the supine position.  Local anesthetic  was injected into the skin near the umbilicus and an incision made. The Veress needle was placed. Pneumoperitoneum was then created with CO2 and tolerated well without any adverse changes in the patient's vital signs. A 74mm port was placed in the periumbilical position and the abdominal cavity was explored.  Two 5-mm ports were placed in the right upper quadrant and a 12 mm epigastric port was placed all under direct vision. All skin incisions  were infiltrated with a local anesthetic agent  before making the incision and placing the trocars.   The patient was positioned  in reverse Trendelenburg, tilted slightly to the patient's left.  The gallbladder was identified, the fundus grasped and retracted cephalad. Adhesions were lysed bluntly. The infundibulum was grasped and retracted laterally, exposing the peritoneum overlying the triangle of Calot. This was then divided and exposed in a blunt fashion. A critical view of the cystic duct and cystic artery was obtained.  The cystic duct was clearly identified and bluntly dissected.   The cystic artery was doubly clipped and divided.  This allowed for good visualization of the cystic duct as it entered the infundibulum of the gallbladder where a large gallstone was present.  The cystic duct was then doubly clipped and divided.  The gallbladder was taken from the gallbladder fossa in a retrograde fashion with the electrocautery. The gallbladder was removed and placed in an Endocatch bag. The liver bed was irrigated and inspected. Hemostasis was achieved with the electrocautery. Copious irrigation was utilized and was repeatedly aspirated until clear.  The gallbladder and Endocatch sac were then removed through the epigastric port site.   Inspection of the right upper quadrant was performed. No bleeding, bile duct injury or leak, or bowel injury was noted. Pneumoperitoneum was released.  The epigastric port site was closed with figure-of-eight 0 Vicryl sutures. 4-0 subcuticular Monocryl was used to close the skin. Steristrips and Mastisol and sterile dressings were  applied.  The patient was then extubated and brought to the recovery room in stable condition. Sponge, lap, and needle counts were correct at closure and at the conclusion of the case.   Findings: Chronic cholecystitis with gallstones Estimated Blood Loss: Minimal         Drains: None  Specimens: Gallbladder           Complications: none               Meshilem Machuca E.  Burt Knack, MD, FACS

## 2017-05-21 NOTE — Anesthesia Procedure Notes (Signed)
Procedure Name: Intubation Date/Time: 05/21/2017 9:49 AM Performed by: Nile Riggs, CRNA Pre-anesthesia Checklist: Patient identified, Emergency Drugs available, Suction available, Patient being monitored and Timeout performed Patient Re-evaluated:Patient Re-evaluated prior to induction Oxygen Delivery Method: Circle system utilized Preoxygenation: Pre-oxygenation with 100% oxygen Induction Type: IV induction and Cricoid Pressure applied Ventilation: Mask ventilation without difficulty Laryngoscope Size: Miller and 2 Grade View: Grade II Tube type: Oral Tube size: 7.5 mm Number of attempts: 1 Airway Equipment and Method: Stylet Placement Confirmation: ETT inserted through vocal cords under direct vision,  positive ETCO2,  CO2 detector and breath sounds checked- equal and bilateral Secured at: 21 cm Tube secured with: Tape Dental Injury: Teeth and Oropharynx as per pre-operative assessment

## 2017-05-21 NOTE — OR Nursing (Signed)
Nausea when getting dressed.  Phenergan given per [facility],

## 2017-05-21 NOTE — Anesthesia Post-op Follow-up Note (Signed)
Anesthesia QCDR form completed.        

## 2017-05-21 NOTE — Transfer of Care (Signed)
Immediate Anesthesia Transfer of Care Note  Patient: Grace [name]  Procedure(s) Performed: LAPAROSCOPIC CHOLECYSTECTOMY (N/A )  Patient Location: PACU  Anesthesia Type:General  Level of Consciousness: awake, alert , oriented and patient cooperative  Airway & Oxygen Therapy: Patient Spontanous Breathing  Post-op Assessment: Report given to RN, Post -op Vital signs reviewed and stable and Patient moving all extremities  Post vital signs: Reviewed and stable  Last Vitals:  Vitals Value Taken Time  BP 106/59 05/21/2017 10:41 AM  Temp 36.8 C 05/21/2017 10:41 AM  Pulse 74 05/21/2017 10:43 AM  Resp 19 05/21/2017 10:43 AM  SpO2 100 % 05/21/2017 10:43 AM  Vitals shown include unvalidated device data.  Last Pain:  Vitals:   05/21/17 1041  TempSrc:   PainSc: 0-No pain         Complications: No apparent anesthesia complications

## 2017-05-21 NOTE — OR Nursing (Signed)
Napped after phenergan given, awake and alert 1400, denies nausea, IV d/c'd, and pt d/c'd to home.

## 2017-05-21 NOTE — Progress Notes (Signed)
Preoperative Review   Patient is met in the preoperative holding area. The history is reviewed in the chart and with the patient. I personally reviewed the options and rationale as well as the risks of this procedure that have been previously discussed with the patient. All questions asked by the patient and/or family were answered to their satisfaction.  I also discussed the findings of EGD and colonoscopy including surgical pathology with the patient.  These findings and the findings of her prior work-up for gallbladder disease suggest the need for cholecystectomy.  Patient agrees to proceed with this procedure at this time.  Florene Glen M.D. FACS

## 2017-05-21 NOTE — Anesthesia Preprocedure Evaluation (Signed)
Anesthesia Evaluation  Patient identified by MRN, date of birth, ID band Patient awake    Reviewed: Allergy & Precautions, NPO status , Patient's Chart, lab work & pertinent test results  History of Anesthesia Complications Negative for: history of anesthetic complications  Airway Mallampati: III  TM Distance: >3 FB Neck ROM: Full    Dental  (+) Caps, Dental Advidsory Given, Teeth Intact   Pulmonary neg shortness of breath, asthma , neg sleep apnea, neg recent URI,    breath sounds clear to auscultation- rhonchi (-) wheezing      Cardiovascular Exercise Tolerance: Good (-) hypertension(-) angina(-) CAD, (-) Past MI and (-) Cardiac Stents (-) dysrhythmias  Rhythm:Regular Rate:Normal - Systolic murmurs and - Diastolic murmurs    Neuro/Psych negative neurological ROS  negative psych ROS   GI/Hepatic Neg liver ROS, GERD  ,  Endo/Other  neg diabetesMorbid obesity  Renal/GU negative Renal ROS     Musculoskeletal negative musculoskeletal ROS (+)   Abdominal (+) + obese,   Peds  Hematology negative hematology ROS (+)   Anesthesia Other Findings Past Medical History: 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     Comment:  Qualifier: Diagnosis of  By: Marcelino Scot CMA, [place] Bilberry   No date: Allergic rhinitis No date: Asthma 05/07/2012: GERD (gastroesophageal reflux disease) 11/08/2007: HYPOGLYCEMIA     Comment:  Qualifier: Diagnosis of  By: Marcelino Scot CMA, [place] Bilberry   09/14/2009: Other chest pain     Comment:  Qualifier: Diagnosis of  By: Rockey Situ MD, Tim     Reproductive/Obstetrics                             Anesthesia Physical  Anesthesia Plan  ASA: III  Anesthesia Plan: General   Post-op Pain Management:    Induction: Intravenous  PONV Risk Score and Plan: 2 and Ondansetron and Dexamethasone  Airway Management Planned: Oral ETT  Additional Equipment:   Intra-op Plan:   Post-operative Plan:  Extubation in OR  Informed Consent: I have reviewed the patients History and Physical, chart, labs and discussed the procedure including the risks, benefits and alternatives for the proposed anesthesia with the patient or authorized representative who has indicated his/her understanding and acceptance.   Dental advisory given  Plan Discussed with: CRNA and Anesthesiologist  Anesthesia Plan Comments:         Anesthesia Quick Evaluation

## 2017-05-22 ENCOUNTER — Telehealth: Payer: Self-pay | Admitting: Surgery

## 2017-05-22 LAB — SURGICAL PATHOLOGY

## 2017-05-22 NOTE — Telephone Encounter (Signed)
Please call patient and make a post op appointment. Patient left message on voicemail.

## 2017-05-31 ENCOUNTER — Ambulatory Visit (INDEPENDENT_AMBULATORY_CARE_PROVIDER_SITE_OTHER): Payer: BLUE CROSS/BLUE SHIELD | Admitting: Surgery

## 2017-05-31 ENCOUNTER — Encounter: Payer: Self-pay | Admitting: Surgery

## 2017-05-31 VITALS — BP 126/80 | HR 76 | Temp 97.8°F | Wt 216.0 lb

## 2017-05-31 DIAGNOSIS — K8050 Calculus of bile duct without cholangitis or cholecystitis without obstruction: Secondary | ICD-10-CM

## 2017-05-31 NOTE — Progress Notes (Signed)
Outpatient postop visit  05/31/2017  Grace [name] is an 52 y.o. female.    Procedure:lap chole  CC: No problems  HPI: Patient experienced one-time drainage last week from 1 of the wounds.  But none since.  She feels well and is back to her normal activities.  Medications reviewed.    Physical Exam:  LMP 05/18/2015 (Approximate)     PE: No icterus no jaundice abdomen soft nontender no sign of infection no drainage no open wounds.    Assessment/Plan:  Pathology reviewed.  Patient doing very well recommend follow-up on an as-needed basis  Florene Glen, MD, FACS

## 2017-05-31 NOTE — Patient Instructions (Signed)

## 2017-06-13 ENCOUNTER — Ambulatory Visit: Payer: BLUE CROSS/BLUE SHIELD | Admitting: Gastroenterology

## 2017-06-29 ENCOUNTER — Ambulatory Visit: Payer: BLUE CROSS/BLUE SHIELD | Admitting: Gastroenterology

## 2017-06-29 ENCOUNTER — Encounter: Payer: Self-pay | Admitting: Gastroenterology

## 2017-06-29 VITALS — BP 121/80 | HR 88 | Temp 98.1°F | Ht 61.0 in | Wt 216.0 lb

## 2017-06-29 DIAGNOSIS — R1013 Epigastric pain: Secondary | ICD-10-CM | POA: Diagnosis not present

## 2017-06-29 DIAGNOSIS — K5909 Other constipation: Secondary | ICD-10-CM | POA: Diagnosis not present

## 2017-06-29 NOTE — Progress Notes (Signed)
Cephas Darby, MD [address]  [place]  [place], [place] [address]  Main: [phone]  Fax: [phone]    Gastroenterology Consultation  Referring Provider:     Lenard Simmer, MD Primary Care Physician:  Lenard Simmer, MD Primary Gastroenterologist:  Dr. Cephas Darby Reason for Consultation:     Epigastric pain, GERD and chronic constipation        HPI:   Grace [name] is a 52 y.o. female referred by Dr. Ronnald Collum, Lourdes Sledge, MD  for consultation & management of chronic heartburn, chronic constipation and epigastric pain  Chronic heartburn: patient has been experiencing severe burning pain in the chest for a few years, almost on a daily basis. She used to take excellent which worked well, later switched to omeprazole 40 mg daily by her primary care physician. Her symptoms occur at night since switching to omeprazole. She denies difficulty swallowing. She wants to know if she can go back to Dexilant  Chronic constipation: she has infrequent bowel movements associated with significant straining and hard lumpy stool small amounts associated with incomplete emptying, always feels full, bloating. This has been ongoing for at least 1 year. Her life is mostly sedentary due to her job that involves lot of travel. She also gained weight significantly in last couple of years. she admits to eating high calorie foods and less fiber in her diet. She has not tried any stool softeners. TSH normal in 01/2014  Epigastric pain/right upper quadrant pain: Patient has been experiencing chronic epigastric pain and burning type. Last week, she went to emergency room because of severe attack of right upper quadrant pain after a fatty meal. Ultrasound and CT revealed a stone in the cystic duct and gallbladder. She saw Dr. Burt Knack, general surgeon and scheduled for cholecystectomy on 05/21/2017. She no longer has right upper quadrant pain. She had normal lipase, normal LFTs and there was no  evidence of choledocholithiasis or gallstone induced pancreatitis. She continues to have ongoing epigastric pain. She denies smoking. She consumes alcohol occasionally  Follow-up visit 06/29/2017 Patient reports that her symptoms have significantly improved. She is currently on omeprazole 20 mg 2 times daily with complete resolution of her epigastric pain. She also underwent laparoscopic cholecystectomy without any complications. Her constipation is significantly controlled with high-fiber diet and fiber supplements. She is also trying to adapt healthy lifestyle  NSAIDs: none  Antiplts/Anticoagulants/Anti thrombotics: none  GI Procedures: EGD 05/16/17 - Normal duodenal bulb and second portion of the duodenum. - Normal stomach. Biopsied. - Normal gastroesophageal junction and esophagus.  Colonoscopy 05/16/2017 - One 5 mm polyp in the rectum, removed with a cold snare. Resected and retrieved. - The entire examined colon is normal. - The distal rectum and anal verge are normal on retroflexion view. DIAGNOSIS:  A. STOMACH; COLD BIOPSY:  - UNREMARKABLE ANTRAL MUCOSA.  - OXYNTIC MUCOSA WITH CHANGES CONSISTENT WITH PROTON PUMP INHIBITOR USE.  - NEGATIVE FOR H. PYLORI, DYSPLASIA, AND MALIGNANCY.   B. RECTUM POLYP; COLD SNARE:  - HYPERPLASTIC POLYP.  - NEGATIVE FOR DYSPLASIA AND MALIGNANCY.   She denies family history of GI malignancy  Past Medical History:  Diagnosis Date  . 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 Allergic rhinitis   . Bronchitis   . Cardiomyopathy, peripartum, postpartum condition    THIS RESOLVED 6 MONTHS AFTER DELIVERY  . GERD (gastroesophageal reflux disease) 05/07/2012  . HYPOGLYCEMIA 11/08/2007  Qualifier: Diagnosis of  By: Marcelino Scot CMA, [place] Bilberry    . Other chest pain 09/14/2009   Qualifier: Diagnosis of  By: Rockey Situ MD, Tim      Past Surgical History:  Procedure Laterality Date  . caesarean section    .  CHOLECYSTECTOMY N/A 05/21/2017   Procedure: LAPAROSCOPIC CHOLECYSTECTOMY;  Surgeon: Florene Glen, MD;  Location: [facility] ORS;  Service: General;  Laterality: N/A;  . COLONOSCOPY WITH PROPOFOL N/A 05/16/2017   Procedure: COLONOSCOPY WITH PROPOFOL;  Surgeon: Lin Landsman, MD;  Location: [facility] ENDOSCOPY;  Service: Gastroenterology;  Laterality: N/A;  . ESOPHAGOGASTRODUODENOSCOPY (EGD) WITH PROPOFOL N/A 05/16/2017   Procedure: ESOPHAGOGASTRODUODENOSCOPY (EGD) WITH PROPOFOL;  Surgeon: Lin Landsman, MD;  Location: [facility] ENDOSCOPY;  Service: Gastroenterology;  Laterality: N/A;  . htn     pulmonary edema with childbith, had cardiac w/ up  . SPIROMETRY  12/07   at allergist's- normal  . WISDOM TOOTH EXTRACTION       Current Outpatient Medications:  .  albuterol (PROVENTIL HFA;VENTOLIN HFA) 108 (90 Base) MCG/ACT inhaler, Inhale 2 puffs into the lungs every 6 (six) hours as needed for wheezing or shortness of breath., Disp: 1 Inhaler, Rfl: 2 .  EPINEPHrine (EPIPEN 2-PAK) 0.3 mg/0.3 mL IJ SOAJ injection, Inject 0.3 mg into the muscle once as needed. , Disp: , Rfl:  .  furosemide (LASIX) 20 MG tablet, TAKE 1 TABLET BY MOUTH EVERY DAY IN THE MORNING, Disp: , Rfl: 5 .  mometasone (NASONEX) 50 MCG/ACT nasal spray, Place 2 sprays into the nose daily as needed. , Disp: , Rfl:  .  Multiple Vitamins-Minerals (MULTIVITAL) tablet, Take 1 tablet by mouth daily.  , Disp: , Rfl:  .  Vitamin D, Ergocalciferol, (DRISDOL) 50000 UNITS CAPS, Take 50,000 Units by mouth every 7 (seven) days., Disp: , Rfl:  .  omeprazole (PRILOSEC) 20 MG capsule, Take 1 capsule (20 mg total) by mouth 2 (two) times daily before a meal. (Patient taking differently: Take 40 mg by mouth every morning. ), Disp: 60 capsule, Rfl: 2   Family History  Problem Relation Age of Onset  . Hyperlipidemia Mother   . Hypertension Mother   . Hypertension Father   . Hyperlipidemia Father   . Breast cancer Maternal Aunt 51  . Breast cancer  Maternal Grandmother 81     Social History   Tobacco Use  . Smoking status: Never Smoker  . Smokeless tobacco: Never Used  Substance Use Topics  . Alcohol use: Yes    Comment: occas  . Drug use: No    Allergies as of 06/29/2017 - Review Complete 06/29/2017  Allergen Reaction Noted  . Cephalexin    . Eggs or egg-derived products Itching 01/30/2014  . Gluten meal Swelling 01/30/2014  . Lactase Itching 01/30/2014  . Penicillins    . Zyrtec [cetirizine] Hives 03/12/2012    Review of Systems:    All systems reviewed and negative except where noted in HPI.   Physical Exam:  BP 121/80   Pulse 88   Temp 98.1 F (36.7 C) (Oral)   Ht 5\' 1"  (1.549 m)   Wt 216 lb (98 kg)   LMP 05/18/2015 (Approximate)   BMI 40.81 kg/m  Patient's last menstrual period was 05/18/2015 (approximate).  General:   Alert,  Well-developed, well-nourished, pleasant and cooperative in NAD Head:  Normocephalic and atraumatic. Eyes:  Sclera clear, no icterus.   Conjunctiva pink. Ears:  Normal auditory acuity. Nose:  No deformity, discharge, or lesions. Mouth:  No deformity or lesions,oropharynx pink & moist. Neck:  Supple; no masses or thyromegaly. Lungs:  Respirations even and unlabored.  Clear throughout to auscultation.   No wheezes, crackles, or rhonchi. No acute distress. Heart:  Regular rate and rhythm; no murmurs, clicks, rubs, or gallops. Abdomen:  Normal bowel sounds. Soft,obese, nontender, and nondistended, without masses, hepatosplenomegaly or hernias noted.  No guarding or rebound tenderness.   Rectal: Not performed Msk:  Symmetrical without gross deformities. Good, equal movement & strength bilaterally. Pulses:  Normal pulses noted. Extremities:  No clubbing or edema.  No cyanosis. Neurologic:  Alert and oriented x3;  grossly normal neurologically. Skin:  Intact without significant lesions or rashes. No jaundice. Psych:  Alert and cooperative. Normal mood and affect.  Imaging  Studies: reviewed  Assessment and Plan:   AASHRITHA MIEDEMA is a 52 y.o.Caucasian female with follow-up of chronic GERD, chronic constipation, biliary colic, cholelithiasis on CT and ultrasound. She underwent endoscopic cholecystectomy and here for follow-up. Her symptoms are currently under control  Chronic GERD/dyspepsia:improved Continue omeprazole 20 mg twice daily EGD unremarkable Encouraged her with healthy lifestyle changes  Chronic constipation: improved continue high-fiber diet, incorporating at least 30 g of fiber in her diet continue fiber supplements She tried linaclotide 145 MCG daily which helped. She prefers to have the prescription if her constipation and requires  Biliary colic: Status post cholecystectomy by Dr. Burt Knack  Colon cancer screening: Normal colonoscopy, recommend surveillance colonoscopy at age 4   Follow up as needed   Cephas Darby, MD

## 2017-08-12 ENCOUNTER — Other Ambulatory Visit: Payer: Self-pay | Admitting: Gastroenterology

## 2017-08-23 ENCOUNTER — Emergency Department
Admission: EM | Admit: 2017-08-23 | Discharge: 2017-08-23 | Disposition: A | Discharge status: Home / Self Care | Payer: BLUE CROSS/BLUE SHIELD | Attending: Emergency Medicine | Admitting: Emergency Medicine

## 2017-08-23 ENCOUNTER — Emergency Department: Payer: BLUE CROSS/BLUE SHIELD

## 2017-08-23 DIAGNOSIS — R202 Paresthesia of skin: Secondary | ICD-10-CM | POA: Diagnosis not present

## 2017-08-23 DIAGNOSIS — R51 Headache: Secondary | ICD-10-CM | POA: Insufficient documentation

## 2017-08-23 DIAGNOSIS — R200 Anesthesia of skin: Secondary | ICD-10-CM | POA: Diagnosis present

## 2017-08-23 DIAGNOSIS — R519 Headache, unspecified: Secondary | ICD-10-CM

## 2017-08-23 LAB — COMPREHENSIVE METABOLIC PANEL
ALBUMIN: 4.3 g/dL (ref 3.5–5.0)
ALT: 37 U/L (ref 0–44)
ANION GAP: 8 (ref 5–15)
AST: 30 U/L (ref 15–41)
Alkaline Phosphatase: 120 U/L (ref 38–126)
BILIRUBIN TOTAL: 0.5 mg/dL (ref 0.3–1.2)
BUN: 9 mg/dL (ref 6–20)
CO2: 26 mmol/L (ref 22–32)
Calcium: 8.9 mg/dL (ref 8.9–10.3)
Chloride: 108 mmol/L (ref 98–111)
Creatinine, Ser: 0.99 mg/dL (ref 0.44–1.00)
Glucose, Bld: 108 mg/dL — ABNORMAL HIGH (ref 70–99)
POTASSIUM: 3.6 mmol/L (ref 3.5–5.1)
Sodium: 142 mmol/L (ref 135–145)
TOTAL PROTEIN: 7.6 g/dL (ref 6.5–8.1)

## 2017-08-23 LAB — CBC
HEMATOCRIT: 37.9 % (ref 35.0–47.0)
Hemoglobin: 13 g/dL (ref 12.0–16.0)
MCH: 28.6 pg (ref 26.0–34.0)
MCHC: 34.4 g/dL (ref 32.0–36.0)
MCV: 83.3 fL (ref 80.0–100.0)
Platelets: 285 10*3/uL (ref 150–440)
RBC: 4.55 MIL/uL (ref 3.80–5.20)
RDW: 16.1 % — AB (ref 11.5–14.5)
WBC: 11.3 10*3/uL — ABNORMAL HIGH (ref 3.6–11.0)

## 2017-08-23 MED ORDER — KETOROLAC TROMETHAMINE 30 MG/ML IJ SOLN
30.0000 mg | Freq: Once | INTRAMUSCULAR | Status: AC
  Administered 2017-08-23: 30 mg via INTRAVENOUS
  Filled 2017-08-23: qty 1

## 2017-08-23 MED ORDER — BUTALBITAL-APAP-CAFFEINE 50-325-40 MG PO TABS: 1.0000 | ORAL_TABLET | Freq: Four times a day (QID) | ORAL | 0 refills | Status: AC | PRN

## 2017-08-23 MED ORDER — METOCLOPRAMIDE HCL 5 MG/ML IJ SOLN
10.0000 mg | Freq: Once | INTRAMUSCULAR | Status: AC
  Administered 2017-08-23: 10 mg via INTRAVENOUS
  Filled 2017-08-23: qty 2

## 2017-08-23 MED ORDER — LORAZEPAM 2 MG/ML IJ SOLN
0.5000 mg | Freq: Once | INTRAMUSCULAR | Status: AC
  Administered 2017-08-23: 0.5 mg via INTRAVENOUS
  Filled 2017-08-23: qty 1

## 2017-08-23 MED ORDER — SODIUM CHLORIDE 0.9 % IV SOLN
Freq: Once | INTRAVENOUS | Status: AC
  Administered 2017-08-23: 22:00:00 via INTRAVENOUS

## 2017-08-23 NOTE — ED Provider Notes (Signed)
[facility] Emergency Department Provider Note       Time seen: ----------------------------------------- 9:40 PM on 08/23/2017 -----------------------------------------   I have reviewed the triage vital signs and the nursing notes.  HISTORY   Chief Complaint Headache and Numbness    HPI Grace [name] is a 52 y.o. female with a history of bronchitis, cardiomyopathy, GERD, migraines who presents to the ED for this and headache since early June.  Patient went to an urgent care and was diagnosed with bilateral ear infections.  She was prescribed antibiotics and prednisone for a week.  Patient reports a headache resolved but returned in early July.  She went to ENT for follow-up.  She complains of bilateral facial tingling but continued headache.  Patient states she can feel the inside of her ears and she can feel her teeth this was the reason she thought she had a sinus infection.  Past Medical History:  Diagnosis Date  . 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 Allergic rhinitis   . Bronchitis   . Cardiomyopathy, peripartum, postpartum condition    THIS RESOLVED 6 MONTHS AFTER DELIVERY  . GERD (gastroesophageal reflux disease) 05/07/2012  . HYPOGLYCEMIA 11/08/2007   Qualifier: Diagnosis of  By: Marcelino Scot CMA, Auburn Bilberry    . Other chest pain 09/14/2009   Qualifier: Diagnosis of  By: Rockey Situ MD, Tim      Patient Active Problem List   Diagnosis Date Noted  . Biliary colic   . Abdominal pain, chronic, epigastric   . Encounter for screening colonoscopy   . Allergic reaction to food 12/09/2013  . Chronic idiopathic urticaria 12/09/2013  . Rash and nonspecific skin eruption 12/09/2013  . GERD (gastroesophageal reflux disease) 05/07/2012  . HYPERLIPIDEMIA-MIXED 09/14/2009  . DYSPNEA 09/14/2009  . OTHER CHEST PAIN 09/14/2009  . ONYCHIA AND PARONYCHIA OF FINGER 11/11/2007  . HYPOGLYCEMIA 11/08/2007  .  HEMORRHOIDS 11/08/2007  . ALLERGIC RHINITIS 11/08/2007  . 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 caesarean section    . CHOLECYSTECTOMY N/A 05/21/2017   Procedure: LAPAROSCOPIC CHOLECYSTECTOMY;  Surgeon: Florene Glen, MD;  Location: [facility] ORS;  Service: General;  Laterality: N/A;  . COLONOSCOPY WITH PROPOFOL N/A 05/16/2017   Procedure: COLONOSCOPY WITH PROPOFOL;  Surgeon: Lin Landsman, MD;  Location: [facility] ENDOSCOPY;  Service: Gastroenterology;  Laterality: N/A;  . ESOPHAGOGASTRODUODENOSCOPY (EGD) WITH PROPOFOL N/A 05/16/2017   Procedure: ESOPHAGOGASTRODUODENOSCOPY (EGD) WITH PROPOFOL;  Surgeon: Lin Landsman, MD;  Location: [facility] ENDOSCOPY;  Service: Gastroenterology;  Laterality: N/A;  . htn     pulmonary edema with childbith, had cardiac w/ up  . SPIROMETRY  12/07   at allergist's- normal  . WISDOM TOOTH EXTRACTION      Allergies Cephalexin; Eggs or egg-derived products; Gluten meal; Lactase; Penicillins; and Zyrtec [cetirizine]  Social History Social History   Tobacco Use  . Smoking status: Never Smoker  . Smokeless tobacco: Never Used  Substance Use Topics  . Alcohol use: Yes    Comment: occas  . Drug use: No   Review of Systems Constitutional: Negative for fever. Eyes: Negative for vision changes ENT:  Negative for congestion, sore throat, positive for burning in her ears Cardiovascular: Negative for chest pain. Respiratory: Negative for shortness of breath. Gastrointestinal: Negative for abdominal pain, vomiting and diarrhea. Musculoskeletal: Negative for back pain. Skin: Negative for rash. Neurological: Positive for headache and paresthesias  All  systems negative/normal/unremarkable except as stated in the HPI  ____________________________________________   PHYSICAL EXAM:  VITAL SIGNS: ED Triage Vitals  Enc Vitals Group     BP 08/23/17 2012 (!) 157/63     Pulse Rate 08/23/17 2012 70     Resp  08/23/17 2012 18     Temp 08/23/17 2012 98.4 F (36.9 C)     Temp Source 08/23/17 2012 Oral     SpO2 08/23/17 2012 100 %     Weight 08/23/17 2013 215 lb (97.5 kg)     Height --      Head Circumference --      Peak Flow --      Pain Score 08/23/17 2013 3     Pain Loc --      Pain Edu? --      Excl. in [place]? --    Constitutional: Alert and oriented. Well appearing and in no distress. Eyes: Conjunctivae are normal. Normal extraocular movements. ENT   Head: Normocephalic and atraumatic.   Nose: No congestion/rhinnorhea.   Mouth/Throat: Mucous membranes are moist.   Neck: No stridor. Cardiovascular: Normal rate, regular rhythm. No murmurs, rubs, or gallops. Respiratory: Normal respiratory effort without tachypnea nor retractions. Breath sounds are clear and equal bilaterally. No wheezes/rales/rhonchi. Gastrointestinal: Soft and nontender. Normal bowel sounds Musculoskeletal: Nontender with normal range of motion in extremities. No lower extremity tenderness nor edema. Neurologic:  Normal speech and language. No gross focal neurologic deficits are appreciated.  Skin:  Skin is warm, dry and intact. No rash noted. Psychiatric: Mood and affect are normal. Speech and behavior are normal.  ____________________________________________  EKG: Interpreted by me.  72 bpm, incomplete right bundle branch block, otherwise normal QRS, normal QT  ____________________________________________  ED COURSE:  As part of my medical decision making, I reviewed the following data within the [place] History obtained from family if available, nursing notes, old chart and ekg, as well as notes from prior ED visits. Patient presented for headache, we will assess with labs and imaging as indicated at this time.   Procedures ____________________________________________   LABS (pertinent positives/negatives)  Labs Reviewed  CBC - Abnormal; Notable for the following components:       Result Value   WBC 11.3 (*)    RDW 16.1 (*)    All other components within normal limits  COMPREHENSIVE METABOLIC PANEL - Abnormal; Notable for the following components:   Glucose, Bld 108 (*)    All other components within normal limits    RADIOLOGY  CT head is unremarkable  ____________________________________________  DIFFERENTIAL DIAGNOSIS   Tension headache, migraine, anxiety  FINAL ASSESSMENT AND PLAN  Headache   Plan: The patient had presented for persistent headache. Patient's labs are reassuring. Patient's imaging was also reassuring.  We provide IV fluids as well as Reglan, Toradol and Ativan.  I will write for low-dose Xanax for home.  She is feeling better and is stable for outpatient follow-up.   Laurence Aly, MD   Note: This note was generated in part or whole with voice recognition software. Voice recognition is usually quite accurate but there are transcription errors that can and very often do occur. I apologize for any typographical errors that were not detected and corrected.     Earleen Newport, MD 08/23/17 2150

## 2017-08-23 NOTE — ED Triage Notes (Signed)
Patient c/o headache beginning in early June. Patient went to urgent care and dx with bilateral ear infections. Patient prescribed antibiotics and prednisone for one week. Patient said headache resolved, and returned early July. Patient went to ENT and had VNG test performed on 7/31.   Patient c/o bilateral facial tingling. Patient c/o continued headache.

## 2017-11-04 ENCOUNTER — Other Ambulatory Visit: Payer: Self-pay | Admitting: Gastroenterology

## 2018-09-15 ENCOUNTER — Encounter: Payer: Self-pay | Admitting: Emergency Medicine

## 2018-09-15 ENCOUNTER — Emergency Department
Admission: EM | Admit: 2018-09-15 | Discharge: 2018-09-15 | Disposition: A | Discharge status: Home / Self Care | Payer: BC Managed Care – PPO | Attending: Emergency Medicine | Admitting: Emergency Medicine

## 2018-09-15 ENCOUNTER — Emergency Department: Payer: BC Managed Care – PPO

## 2018-09-15 ENCOUNTER — Other Ambulatory Visit: Payer: Self-pay

## 2018-09-15 DIAGNOSIS — R0789 Other chest pain: Secondary | ICD-10-CM | POA: Insufficient documentation

## 2018-09-15 DIAGNOSIS — Z79899 Other long term (current) drug therapy: Secondary | ICD-10-CM | POA: Insufficient documentation

## 2018-09-15 DIAGNOSIS — R079 Chest pain, unspecified: Secondary | ICD-10-CM | POA: Diagnosis present

## 2018-09-15 HISTORY — DX: Endocarditis, valve unspecified: I38

## 2018-09-15 LAB — CBC
HCT: 38.1 % (ref 36.0–46.0)
Hemoglobin: 12.5 g/dL (ref 12.0–15.0)
MCH: 27.3 pg (ref 26.0–34.0)
MCHC: 32.8 g/dL (ref 30.0–36.0)
MCV: 83.2 fL (ref 80.0–100.0)
Platelets: 266 10*3/uL (ref 150–400)
RBC: 4.58 MIL/uL (ref 3.87–5.11)
RDW: 15.3 % (ref 11.5–15.5)
WBC: 9.2 10*3/uL (ref 4.0–10.5)
nRBC: 0 % (ref 0.0–0.2)

## 2018-09-15 LAB — TROPONIN I (HIGH SENSITIVITY): Troponin I (High Sensitivity): <2 ng/L (ref <18)

## 2018-09-15 LAB — BASIC METABOLIC PANEL
Anion gap: 8 (ref 5–15)
BUN: 10 mg/dL (ref 6–20)
CO2: 22 mmol/L (ref 22–32)
Calcium: 8.7 mg/dL — ABNORMAL LOW (ref 8.9–10.3)
Chloride: 111 mmol/L (ref 98–111)
Creatinine, Ser: 0.94 mg/dL (ref 0.44–1.00)
GFR calc Af Amer: >60 mL/min (ref >60)
GFR calc non Af Amer: >60 mL/min (ref >60)
Glucose, Bld: 106 mg/dL — ABNORMAL HIGH (ref 70–99)
Potassium: 3.5 mmol/L (ref 3.5–5.1)
Sodium: 141 mmol/L (ref 135–145)

## 2018-09-15 NOTE — ED Triage Notes (Signed)
Pt reports left shoulder pain earlier today along with chest pain and pressure "all day"; pt says the pain got so bad when she was laying in bed and came in to be checked out (per her MD)

## 2018-09-15 NOTE — ED Provider Notes (Signed)
[facility] Emergency Department Provider Note   ____________________________________________    I have reviewed the triage vital signs and the nursing notes.   HISTORY  Chief Complaint Chest Pain     HPI Grace [name] is a 53 y.o. female who presents with complaints of chest pain.  Patient reports she has a long history of GERD and takes omeprazole for this.  This morning around lunchtime she developed pressure-like chest pain in the center of her chest.  She reports he has had this before and has typically associated GERD.  She had a negative stress test last year.  However she reports her symptoms continued on for longer than typical.  She also felt radiation to her left shoulder at one point.  She notes that while the waiting room her symptoms have resolved.  Currently she feels quite well.  No shortness of breath.  No pleurisy.  No fevers chills nausea or vomiting.  Past Medical History:  Diagnosis Date  . 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 Allergic rhinitis   . Bronchitis   . Cardiomyopathy, peripartum, postpartum condition    THIS RESOLVED 6 MONTHS AFTER DELIVERY  . GERD (gastroesophageal reflux disease) 05/07/2012  . Heart valve regurgitation   . Hx of dysplastic nevus 2019   multiple sites  . HYPOGLYCEMIA 11/08/2007   Qualifier: Diagnosis of  By: Marcelino Scot CMA, Auburn Bilberry    . Other chest pain 09/14/2009   Qualifier: Diagnosis of  By: Rockey Situ MD, Tim      Patient Active Problem List   Diagnosis Date Noted  . Biliary colic   . Abdominal pain, chronic, epigastric   . Encounter for screening colonoscopy   . Allergic reaction to food 12/09/2013  . Chronic idiopathic urticaria 12/09/2013  . Rash and nonspecific skin eruption 12/09/2013  . GERD (gastroesophageal reflux disease) 05/07/2012  . HYPERLIPIDEMIA-MIXED 09/14/2009  . DYSPNEA 09/14/2009  . OTHER CHEST PAIN 09/14/2009  .  ONYCHIA AND PARONYCHIA OF FINGER 11/11/2007  . HYPOGLYCEMIA 11/08/2007  . HEMORRHOIDS 11/08/2007  . ALLERGIC RHINITIS 11/08/2007  . 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 caesarean section    . CHOLECYSTECTOMY N/A 05/21/2017   Procedure: LAPAROSCOPIC CHOLECYSTECTOMY;  Surgeon: Florene Glen, MD;  Location: [facility] ORS;  Service: General;  Laterality: N/A;  . COLONOSCOPY WITH PROPOFOL N/A 05/16/2017   Procedure: COLONOSCOPY WITH PROPOFOL;  Surgeon: Lin Landsman, MD;  Location: [facility] ENDOSCOPY;  Service: Gastroenterology;  Laterality: N/A;  . ESOPHAGOGASTRODUODENOSCOPY (EGD) WITH PROPOFOL N/A 05/16/2017   Procedure: ESOPHAGOGASTRODUODENOSCOPY (EGD) WITH PROPOFOL;  Surgeon: Lin Landsman, MD;  Location: [facility] ENDOSCOPY;  Service: Gastroenterology;  Laterality: N/A;  . htn     pulmonary edema with childbith, had cardiac w/ up  . SPIROMETRY  12/07   at allergist's- normal  . WISDOM TOOTH EXTRACTION      Prior to Admission medications   Medication Sig Start Date End Date Taking? Authorizing Provider  albuterol (PROVENTIL HFA;VENTOLIN HFA) 108 (90 Base) MCG/ACT inhaler Inhale 2 puffs into the lungs every 6 (six) hours as needed for wheezing or shortness of breath. 02/26/17  Yes Duanne Guess, PA-C  atenolol (TENORMIN) 25 MG tablet Take 12.5 mg by mouth daily.   Yes [provider]  EPINEPHrine (EPIPEN 2-PAK) 0.3 mg/0.3 mL IJ SOAJ injection Inject 0.3 mg into the muscle once as needed.  11/14/13  Yes [provider]  furosemide (LASIX) 20 MG tablet TAKE 1 TABLET BY MOUTH EVERY DAY IN THE MORNING 04/16/17  Yes [provider]  mometasone (NASONEX) 50 MCG/ACT nasal spray Place 2 sprays into the nose daily as needed.    Yes [provider]  Multiple Vitamins-Minerals (MULTIVITAL) tablet Take 1 tablet by mouth daily.     Yes [provider]  omeprazole (PRILOSEC) 20 MG capsule TAKE 1 CAPSULE (20 MG  TOTAL) BY MOUTH 2 (TWO) TIMES DAILY BEFORE A MEAL. Patient taking differently: Take 40 mg by mouth daily.  11/05/17 09/15/18 Yes Vanga, Tally Due, MD  Vitamin D, Ergocalciferol, (DRISDOL) 50000 UNITS CAPS Take 50,000 Units by mouth every 7 (seven) days.   Yes [provider]     Allergies Cephalexin, Eggs or egg-derived products, Gluten meal, Lactase, Penicillins, and Zyrtec [cetirizine]  Family History  Problem Relation Age of Onset  . Hyperlipidemia Mother   . Hypertension Mother   . Hypertension Father   . Hyperlipidemia Father   . Breast cancer Maternal Aunt 51  . Breast cancer Maternal Grandmother 6    Social History Social History   Tobacco Use  . Smoking status: Never Smoker  . Smokeless tobacco: Never Used  Substance Use Topics  . Alcohol use: Not Currently    Frequency: Never    Comment: occas  . Drug use: No    Review of Systems  Constitutional: No fever/chills Eyes: No visual changes.  ENT: No sore throat. Cardiovascular: As needed as above Respiratory: Denies shortness of breath. Gastrointestinal: No abdominal pain.  No nausea, no vomiting.   Genitourinary: Negative for dysuria. Musculoskeletal: Negative for back pain. Skin: Negative for rash. Neurological: Negative for headaches   ____________________________________________   PHYSICAL EXAM:  VITAL SIGNS: ED Triage Vitals  Enc Vitals Group     BP 09/15/18 2057 (!) 142/57     Pulse Rate 09/15/18 2057 66     Resp 09/15/18 2057 16     Temp 09/15/18 2057 99.1 F (37.3 C)     Temp Source 09/15/18 2057 Oral     SpO2 09/15/18 2057 99 %     Weight 09/15/18 2058 95.3 kg (210 lb)     Height 09/15/18 2058 1.549 m (5\' 1" )     Head Circumference --      Peak Flow --      Pain Score 09/15/18 2058 6     Pain Loc --      Pain Edu? --      Excl. in [place]? --     Constitutional: Alert and oriented.  Eyes: Conjunctivae are normal.   Nose: No congestion/rhinnorhea. Mouth/Throat: Mucous  membranes are moist.    Cardiovascular: Normal rate, regular rhythm. Grossly normal heart sounds.  Good peripheral circulation. Respiratory: Normal respiratory effort.  No retractions. Lungs CTAB. Gastrointestinal: Soft and nontender. No distention.  No CVA tenderness.  Musculoskeletal: No lower extremity tenderness nor edema.  Warm and well perfused Neurologic:  Normal speech and language. No gross focal neurologic deficits are appreciated.  Skin:  Skin is warm, dry and intact. No rash noted. Psychiatric: Mood and affect are normal. Speech and behavior are normal.  ____________________________________________   LABS (all labs ordered are listed, but only abnormal results are displayed)  Labs Reviewed  BASIC METABOLIC PANEL - Abnormal; Notable for the following components:      Result Value   Glucose, Bld 106 (*)    Calcium 8.7 (*)    All other  components within normal limits  CBC  TROPONIN I (HIGH SENSITIVITY)  TROPONIN I (HIGH SENSITIVITY)   ____________________________________________  EKG  ED ECG REPORT I, Lavonia Drafts, the attending physician, personally viewed and interpreted this ECG.  Date: 09/15/2018  Rhythm: normal sinus rhythm QRS Axis: normal Intervals: normal ST/T Wave abnormalities: Nonspecific ST changes Narrative Interpretation: no evidence of acute ischemia  ____________________________________________  RADIOLOGY  Chest x-ray unremarkable ____________________________________________   PROCEDURES  Procedure(s) performed: No  Procedures   Critical Care performed: No ____________________________________________   INITIAL IMPRESSION / ASSESSMENT AND PLAN / ED COURSE  Pertinent labs & imaging results that were available during my care of the patient were reviewed by me and considered in my medical decision making (see chart for details).  Patient presents with chest pressure described above, has been present for greater than 3 hours  however symptoms have essentially resolved at this point.  She reports he is feeling quite well.  Chest x-ray is unremarkable.  EKG is reassuring.  Pending troponin  Troponin normal.  Patient is pain-free, appropriate for discharge at this time    ____________________________________________   FINAL CLINICAL IMPRESSION(S) / ED DIAGNOSES  Final diagnoses:  Atypical chest pain        Note:  This document was prepared using Dragon voice recognition software and may include unintentional dictation errors.   Lavonia Drafts, MD 09/15/18 2308

## 2019-02-13 ENCOUNTER — Ambulatory Visit: Payer: BC Managed Care – PPO | Attending: Internal Medicine

## 2019-02-13 DIAGNOSIS — Z20822 Contact with and (suspected) exposure to covid-19: Secondary | ICD-10-CM

## 2019-02-14 LAB — NOVEL CORONAVIRUS, NAA: SARS-CoV-2, NAA: NOT DETECTED

## 2019-05-01 ENCOUNTER — Ambulatory Visit: Payer: BC Managed Care – PPO | Attending: Internal Medicine

## 2019-05-01 ENCOUNTER — Other Ambulatory Visit: Payer: Self-pay

## 2019-05-01 DIAGNOSIS — Z23 Encounter for immunization: Secondary | ICD-10-CM

## 2019-05-01 NOTE — Progress Notes (Signed)
   Covid-19 Vaccination Clinic  Name:  Grace [name]    MRN: [id number] DOB: Dec 17, 1965  05/01/2019  [name] was observed post Covid-19 immunization for 15 minutes without incident. She was provided with Vaccine Information Sheet and instruction to access the V-Safe system.   [name] was instructed to call 911 with any severe reactions post vaccine: Marland Kitchen Difficulty breathing  . Swelling of face and throat  . A fast heartbeat  . A bad rash all over body  . Dizziness and weakness   Immunizations Administered    Name Date Dose VIS Date Route   Pfizer COVID-19 Vaccine 05/01/2019  9:06 AM 0.3 mL 01/03/2019 Intramuscular   Manufacturer: [place]   Lot: [id number]   [place]: [id number]

## 2019-05-18 IMAGING — MG 2D DIGITAL DIAGNOSTIC UNILATERAL RIGHT MAMMOGRAM WITH CAD AND AD
6 series · 6 of 14 positions shown · non-contrast
Comparison: Previous exam(s).

CLINICAL DATA: The patient was called back for a right breast
asymmetry.

EXAM:
2D DIGITAL DIAGNOSTIC UNILATERAL RIGHT MAMMOGRAM WITH CAD AND
ADJUNCT TOMO

[R MLO]
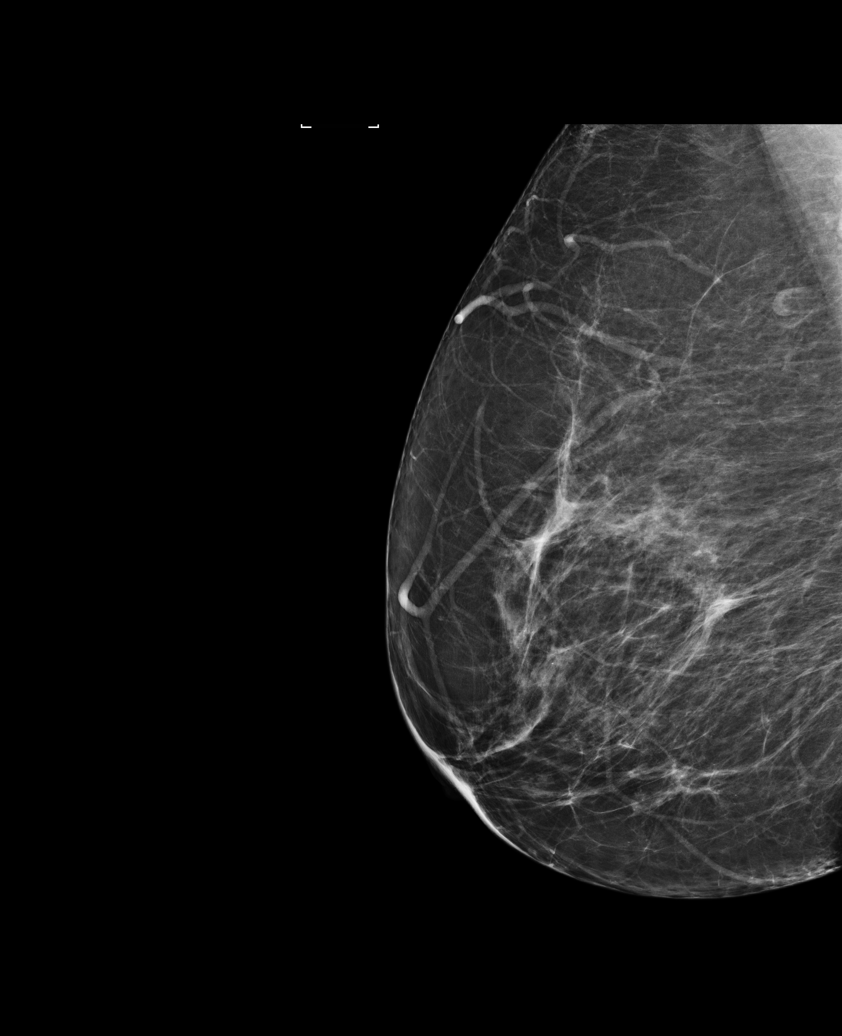

[R CC]
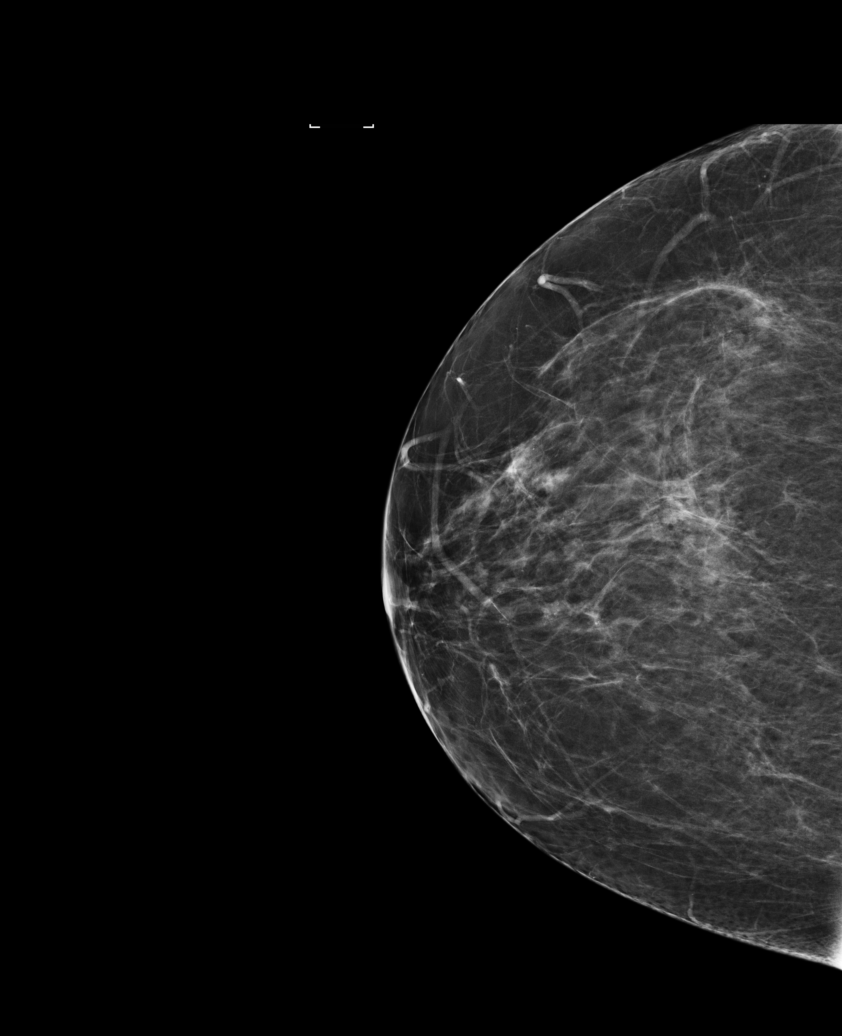

[R CC synth-2D]
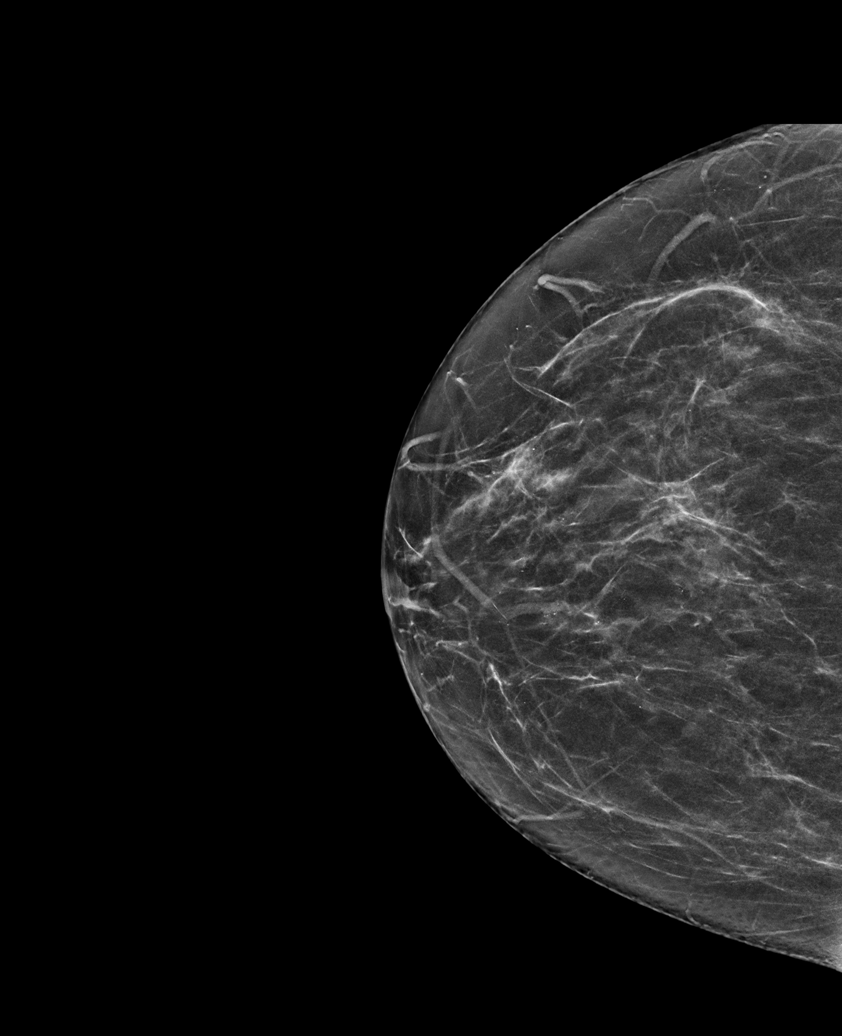

[R MLO synth-2D]
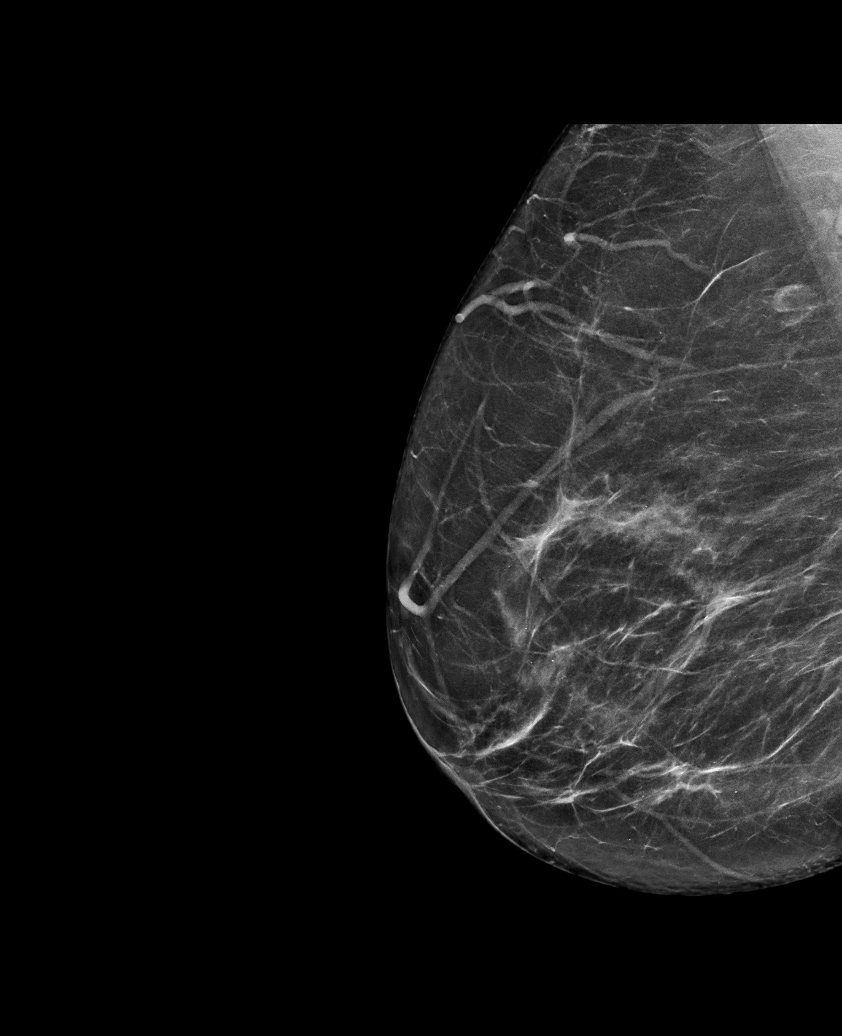

[R CC tomo · tomo slice 33/65.0]
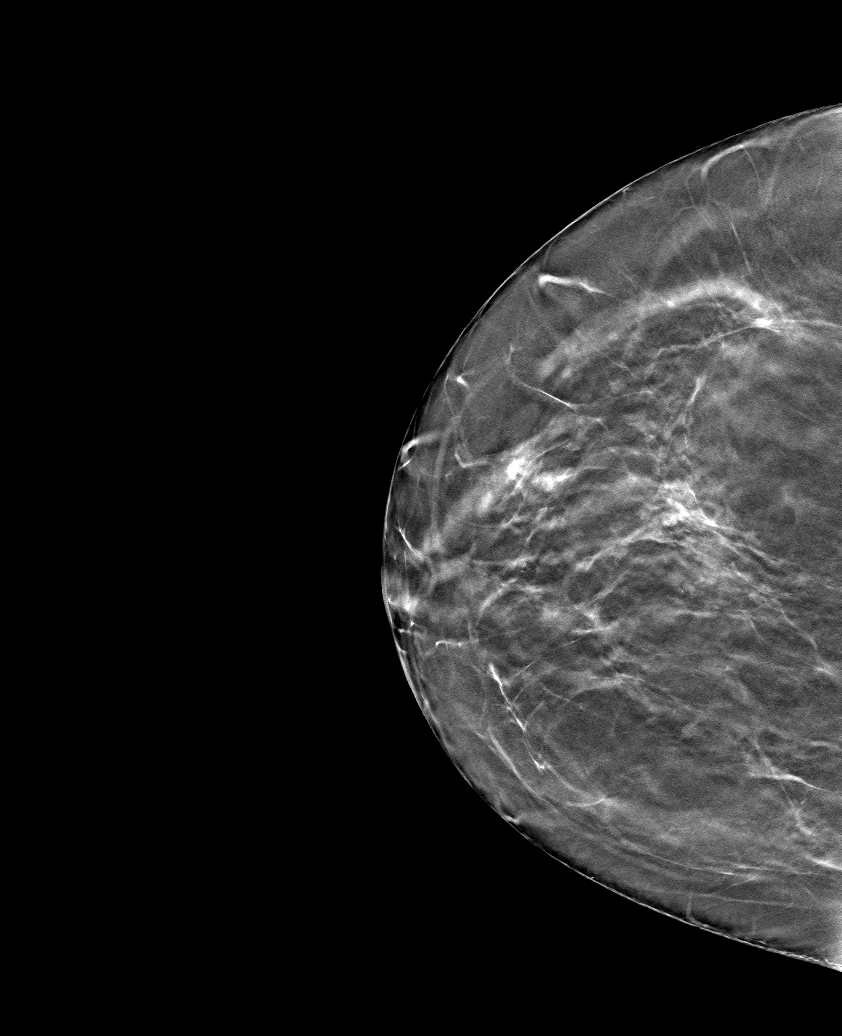

[R MLO tomo · tomo slice 39/78.0]
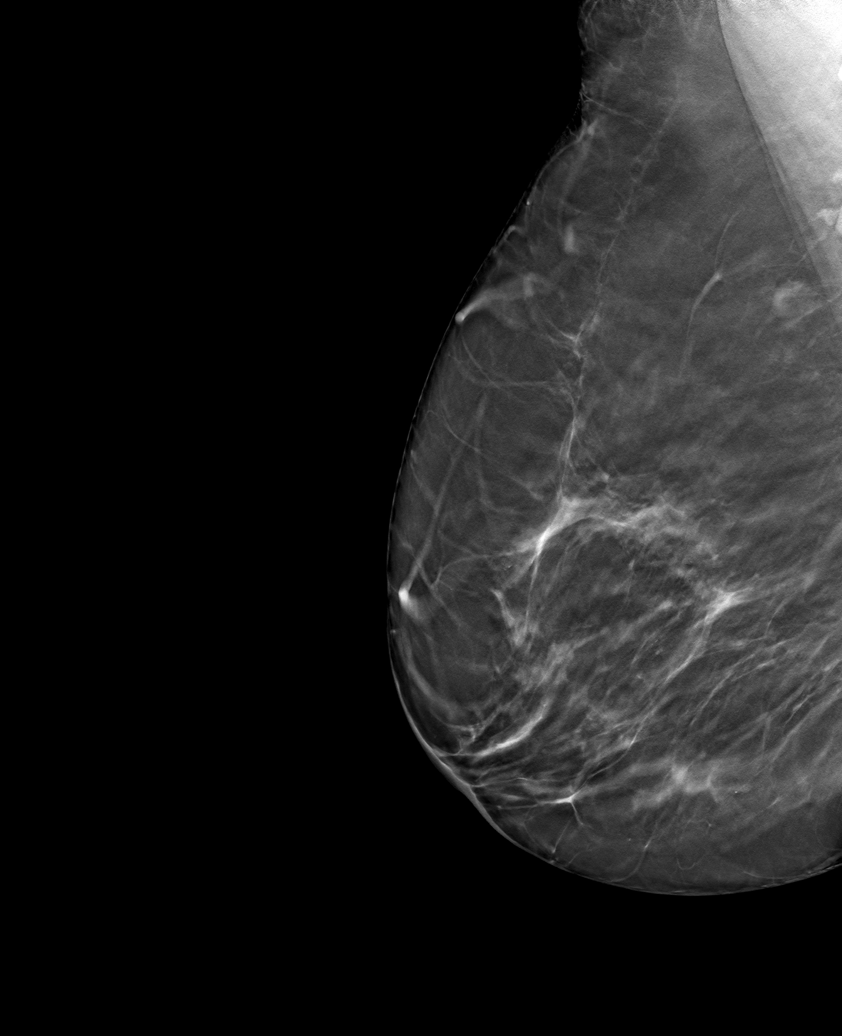

[6 of 14 positions shown; findings below may reference images not displayed]

ACR Breast Density Category b: There are scattered areas of
fibroglandular density.
FINDINGS: The right breast asymmetry resolves on additional imaging.

Mammographic images were processed with CAD.
IMPRESSION: No mammographic evidence of malignancy.

RECOMMENDATION:
Annual screening mammography.

I have discussed the findings and recommendations with the patient.
Results were also provided in writing at the conclusion of the
visit. If applicable, a reminder letter will be sent to the patient
regarding the next appointment.

BI-RADS CATEGORY  2: Benign.

## 2019-05-27 ENCOUNTER — Ambulatory Visit: Payer: BC Managed Care – PPO | Attending: Internal Medicine

## 2019-05-27 DIAGNOSIS — Z23 Encounter for immunization: Secondary | ICD-10-CM

## 2019-05-27 NOTE — Progress Notes (Signed)
   Covid-19 Vaccination Clinic  Name:  Grace [name]    MRN: [id number] DOB: 1965-11-09  05/27/2019  [name] was observed post Covid-19 immunization for 15 minutes without incident. She was provided with Vaccine Information Sheet and instruction to access the V-Safe system.   [name] was instructed to call 911 with any severe reactions post vaccine: Marland Kitchen Difficulty breathing  . Swelling of face and throat  . A fast heartbeat  . A bad rash all over body  . Dizziness and weakness   Immunizations Administered    Name Date Dose VIS Date [place] COVID-19 Vaccine 05/27/2019  2:29 PM 0.3 mL 03/19/2018 Intramuscular   Manufacturer: [place]   Lot: [id number]   [place]: [id number]

## 2019-05-28 ENCOUNTER — Ambulatory Visit: Payer: BC Managed Care – PPO

## 2019-08-09 IMAGING — US US ABDOMEN LIMITED
1 series · 14 of 25 positions shown · non-contrast
Comparison: CT 05/06/2017

CLINICAL DATA: Possible cystic duct stone

EXAM:
ULTRASOUND ABDOMEN LIMITED RIGHT UPPER QUADRANT

[Series 1: us abdomen limited · 14 of 52 slices shown]
[im 1/52]
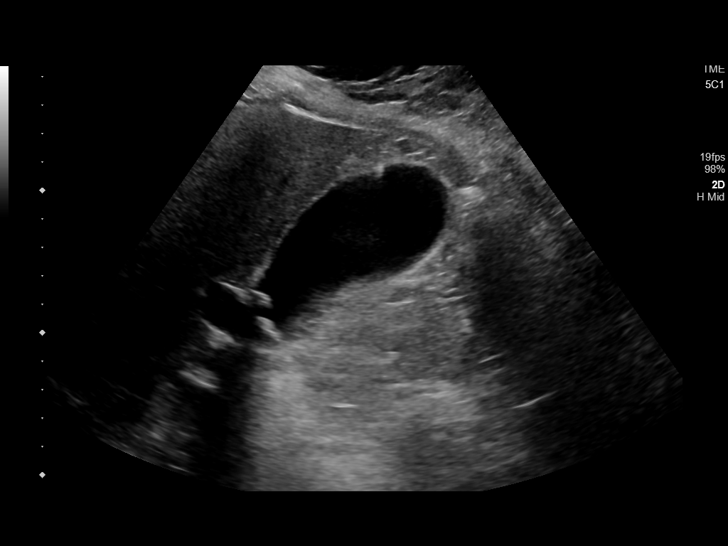
[im 5/52]
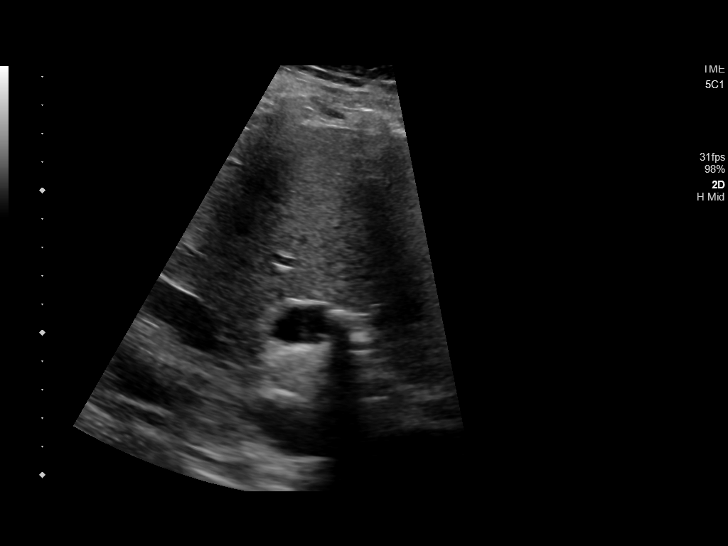
[im 9/52]
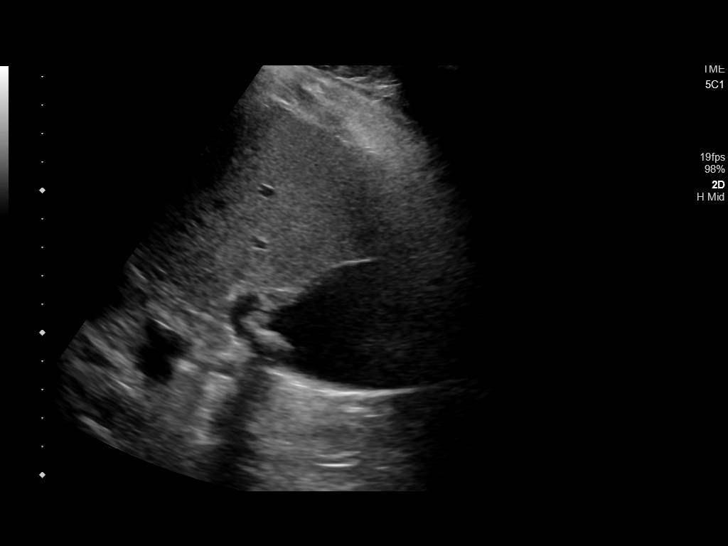
[im 13/52]
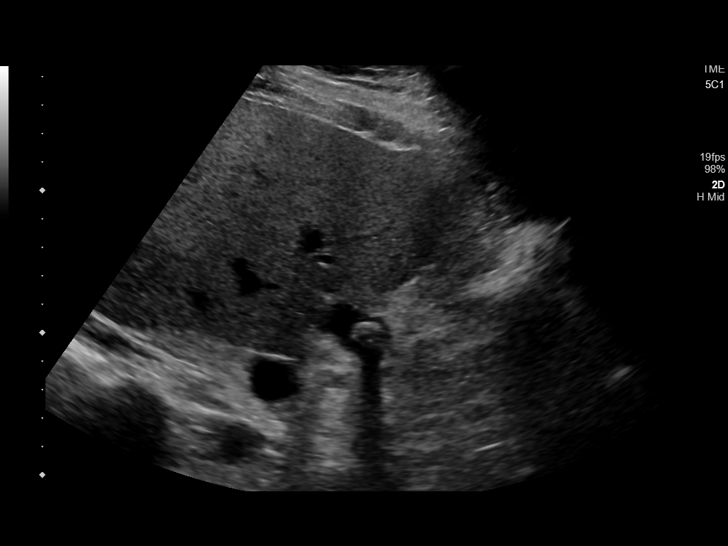
[im 18/52]
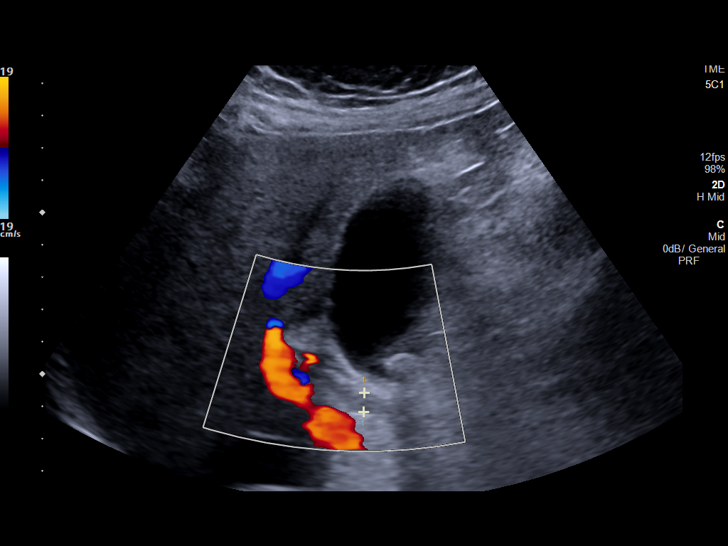
[im 20/52]
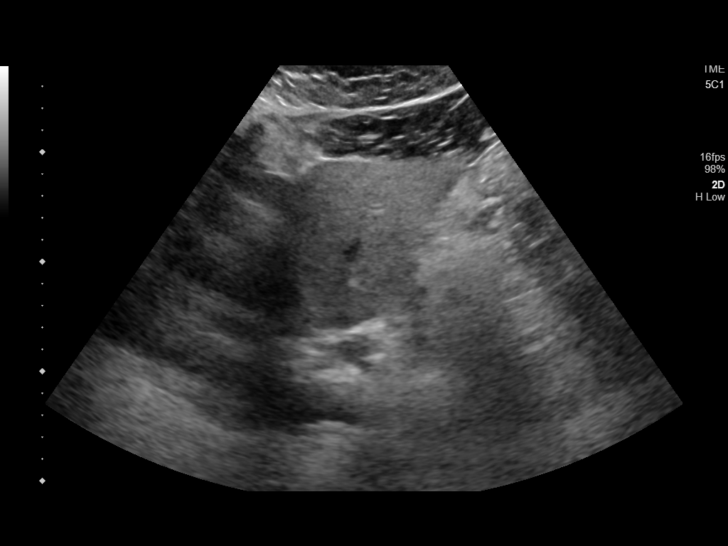
[im 24/52]
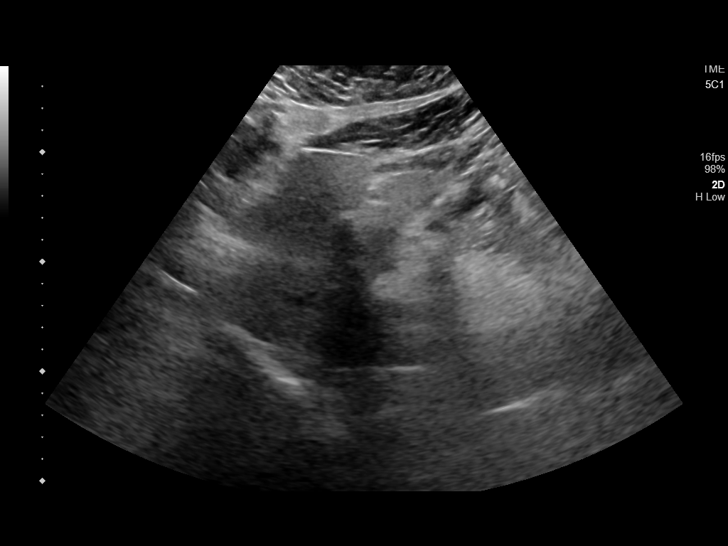
[im 28/52]
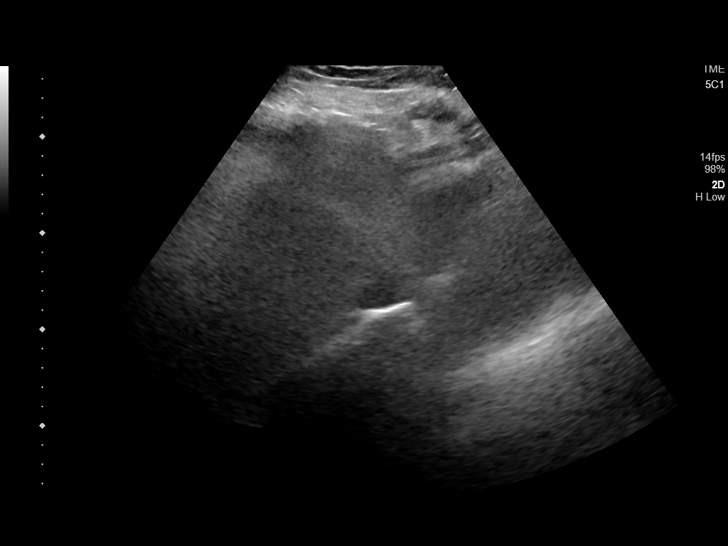
[im 32/52]
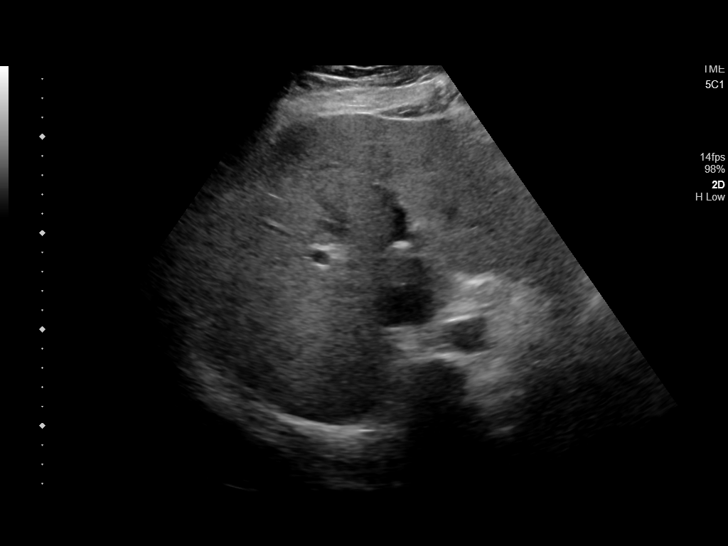
[im 35/52]
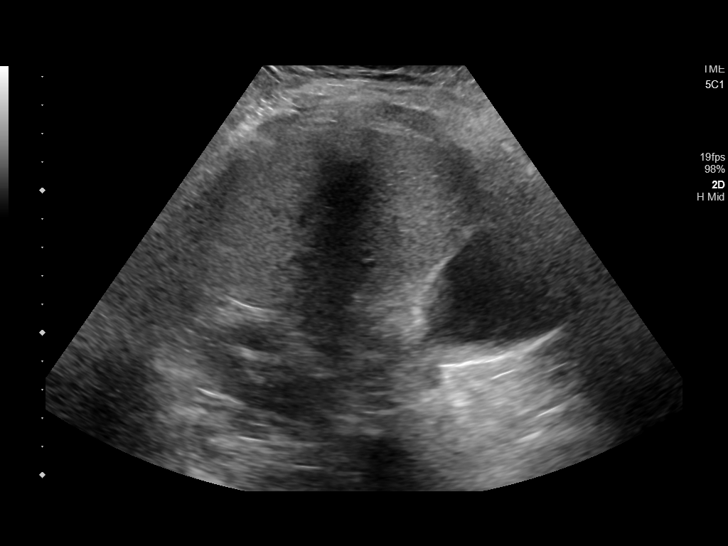
[im 39/52]
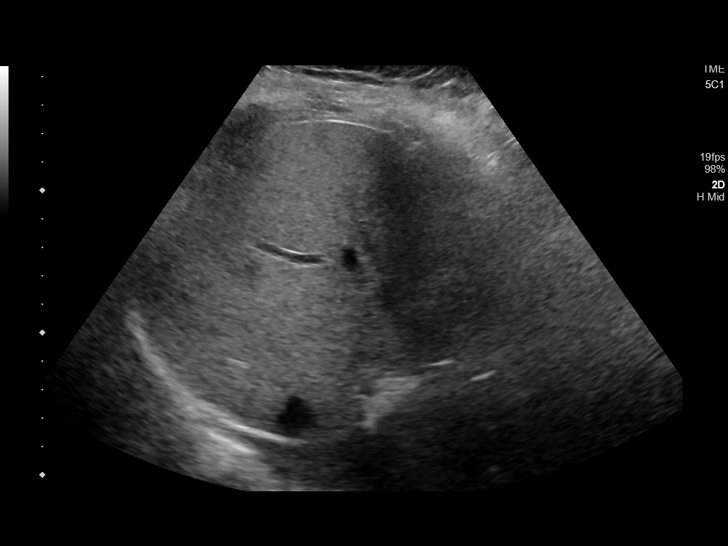
[im 43/52]
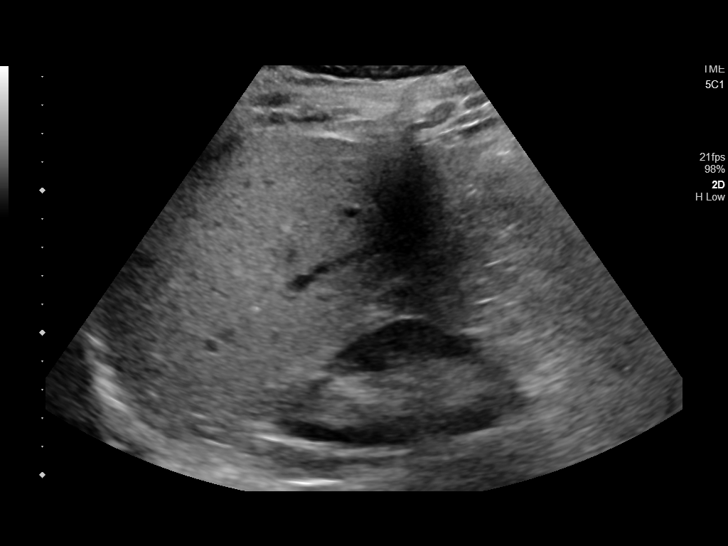
[im 47/52]
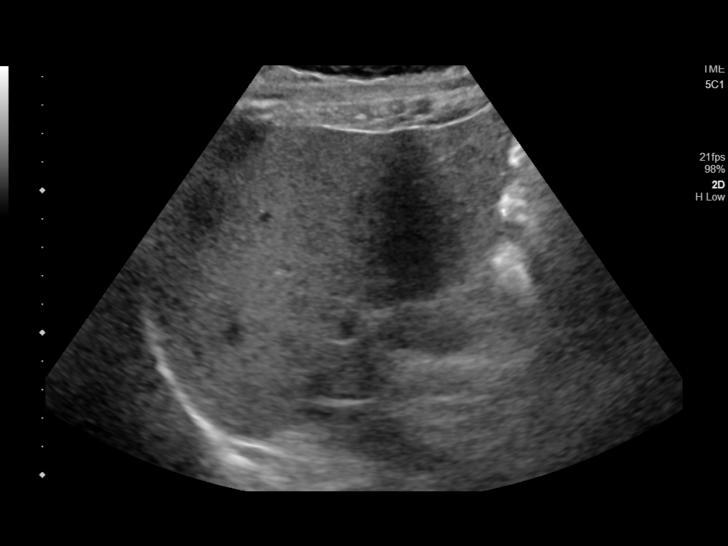
[im 52/52]
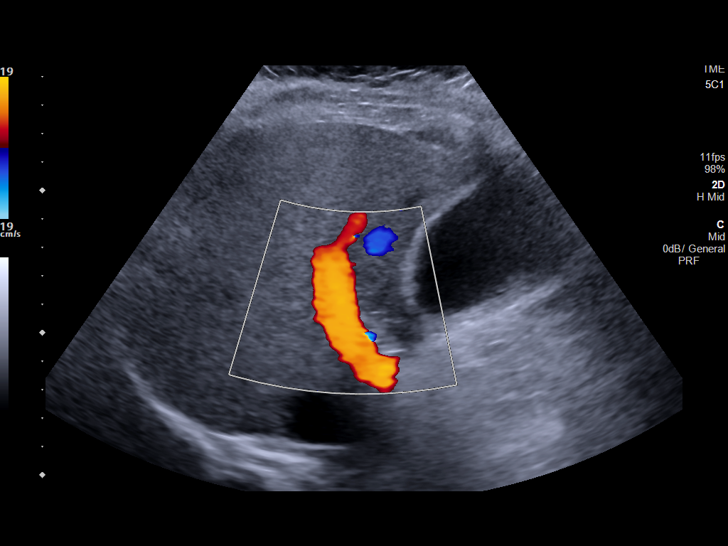

[14 of 25 positions shown; findings below may reference images not displayed]

FINDINGS: Gallbladder:

Multiple stones in the gallbladder measuring up to 11 mm. Non mobile
stone at the gallbladder neck negative sonographic Murphy. Normal
wall thickness.

Common bile duct:

Diameter: 5.9 mm

Liver:

Increased hepatic echogenicity. No focal abnormality. Portal vein is
patent on color Doppler imaging with normal direction of blood flow
towards the liver.
IMPRESSION: 1. Gallstones with non mobile stone in the gallbladder neck but
negative for wall thickening or sonographic Murphy.
2. Negative for biliary dilatation
3. Hepatic steatosis

## 2019-08-25 ENCOUNTER — Other Ambulatory Visit: Payer: Self-pay | Admitting: Obstetrics and Gynecology

## 2019-08-25 DIAGNOSIS — R928 Other abnormal and inconclusive findings on diagnostic imaging of breast: Secondary | ICD-10-CM

## 2019-08-29 ENCOUNTER — Other Ambulatory Visit: Payer: Self-pay | Admitting: Obstetrics and Gynecology

## 2019-08-29 ENCOUNTER — Ambulatory Visit
Admission: RE | Admit: 2019-08-29 | Discharge: 2019-08-29 | Disposition: A | Discharge status: Home / Self Care | Payer: BC Managed Care – PPO | Source: Ambulatory Visit | Attending: Obstetrics and Gynecology | Admitting: Obstetrics and Gynecology

## 2019-08-29 ENCOUNTER — Other Ambulatory Visit: Payer: Self-pay

## 2019-08-29 ENCOUNTER — Ambulatory Visit: Payer: BC Managed Care – PPO

## 2019-08-29 DIAGNOSIS — R928 Other abnormal and inconclusive findings on diagnostic imaging of breast: Secondary | ICD-10-CM

## 2019-08-29 DIAGNOSIS — R921 Mammographic calcification found on diagnostic imaging of breast: Secondary | ICD-10-CM

## 2019-08-29 DIAGNOSIS — N6001 Solitary cyst of right breast: Secondary | ICD-10-CM

## 2020-01-12 ENCOUNTER — Ambulatory Visit: Payer: BC Managed Care – PPO | Admitting: Dermatology

## 2020-03-01 ENCOUNTER — Other Ambulatory Visit: Payer: BC Managed Care – PPO

## 2020-04-12 ENCOUNTER — Other Ambulatory Visit: Payer: Self-pay | Admitting: Obstetrics and Gynecology

## 2020-04-12 ENCOUNTER — Other Ambulatory Visit: Payer: Self-pay

## 2020-04-12 ENCOUNTER — Ambulatory Visit
Admission: RE | Admit: 2020-04-12 | Discharge: 2020-04-12 | Disposition: A | Discharge status: Home / Self Care | Payer: BC Managed Care – PPO | Source: Ambulatory Visit | Attending: Obstetrics and Gynecology | Admitting: Obstetrics and Gynecology

## 2020-04-12 DIAGNOSIS — N6001 Solitary cyst of right breast: Secondary | ICD-10-CM

## 2020-04-12 DIAGNOSIS — R921 Mammographic calcification found on diagnostic imaging of breast: Secondary | ICD-10-CM

## 2020-04-15 ENCOUNTER — Encounter: Payer: Self-pay | Admitting: Emergency Medicine

## 2020-04-15 ENCOUNTER — Emergency Department
Admission: EM | Admit: 2020-04-15 | Discharge: 2020-04-15 | Disposition: A | Discharge status: Home / Self Care | Payer: BC Managed Care – PPO | Attending: Emergency Medicine | Admitting: Emergency Medicine

## 2020-04-15 ENCOUNTER — Other Ambulatory Visit: Payer: Self-pay

## 2020-04-15 ENCOUNTER — Emergency Department: Payer: BC Managed Care – PPO

## 2020-04-15 DIAGNOSIS — R059 Cough, unspecified: Secondary | ICD-10-CM

## 2020-04-15 DIAGNOSIS — R062 Wheezing: Secondary | ICD-10-CM | POA: Insufficient documentation

## 2020-04-15 DIAGNOSIS — Z8616 Personal history of COVID-19: Secondary | ICD-10-CM | POA: Diagnosis not present

## 2020-04-15 DIAGNOSIS — Z20822 Contact with and (suspected) exposure to covid-19: Secondary | ICD-10-CM | POA: Diagnosis not present

## 2020-04-15 LAB — SARS CORONAVIRUS 2 (TAT 6-24 HRS): SARS Coronavirus 2: NEGATIVE

## 2020-04-15 MED ORDER — IPRATROPIUM-ALBUTEROL 0.5-2.5 (3) MG/3ML IN SOLN
3.0000 mL | Freq: Once | RESPIRATORY_TRACT | Status: AC
  Administered 2020-04-15: 3 mL via RESPIRATORY_TRACT
  Filled 2020-04-15: qty 3

## 2020-04-15 MED ORDER — PREDNISONE 10 MG (21) PO TBPK: ORAL_TABLET | ORAL | 0 refills | Status: AC

## 2020-04-15 MED ORDER — PREDNISONE 20 MG PO TABS
60.0000 mg | ORAL_TABLET | Freq: Once | ORAL | Status: AC
  Administered 2020-04-15: 60 mg via ORAL
  Filled 2020-04-15: qty 3

## 2020-04-15 NOTE — ED Provider Notes (Signed)
[facility] Emergency Department Provider Note ____________________________________________   Event Date/Time   First MD Initiated Contact with Patient 04/15/20 0138     (approximate)  I have reviewed the triage vital signs and the nursing notes.  HISTORY  Chief Complaint Cough   HPI Grace [name] is a 55 y.o. femalewho presents to the ED for evaluation of cough and possible wheezing.   Chart review indicates history of seasonal allergies.   Patient just to the ED for evaluation of progressively worsening subacute nonproductive cough.  She reports being diagnosed with COVID-19 in February, and being managed at home.  Since that time, she has been dealing with this nonproductive cough.  She discussed the symptoms with her allergist virtually about 3 weeks ago and was provided a prednisone burst for 4 days, with transient relief of her symptoms.  She reports feeling "normal" while on prednisone, but as soon as she finished this prescription her cough returned.  She presents today for evaluation of this progressively worsening nonproductive cough.  She indicates that she is frequently clearing her throat and her family is occasionally mocking her about this.  Denies sore throat, odynophagia, emesis or abdominal pain.  Denies any chest pain, chest pressure.   She reports being asleep tonight when her daughter awoke her from sleep because daughter said that she could hear patient's audible wheezing from her room.  Due to this, patient presents to the ED for evaluation.  Past Medical History:  Diagnosis Date  . 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 Allergic rhinitis   . Bronchitis   . Cardiomyopathy, peripartum, postpartum condition    THIS RESOLVED 6 MONTHS AFTER DELIVERY  . GERD (gastroesophageal reflux disease) 05/07/2012  . Heart valve regurgitation   . Hx of dysplastic nevus 2019   multiple sites  .  HYPOGLYCEMIA 11/08/2007   Qualifier: Diagnosis of  By: Marcelino Scot CMA, [place] Bilberry    . Other chest pain 09/14/2009   Qualifier: Diagnosis of  By: Rockey Situ MD, Tim      Patient Active Problem List   Diagnosis Date Noted  . Biliary colic   . Abdominal pain, chronic, epigastric   . Encounter for screening colonoscopy   . Allergic reaction to food 12/09/2013  . Chronic idiopathic urticaria 12/09/2013  . Rash and nonspecific skin eruption 12/09/2013  . GERD (gastroesophageal reflux disease) 05/07/2012  . HYPERLIPIDEMIA-MIXED 09/14/2009  . DYSPNEA 09/14/2009  . OTHER CHEST PAIN 09/14/2009  . ONYCHIA AND PARONYCHIA OF FINGER 11/11/2007  . HYPOGLYCEMIA 11/08/2007  . HEMORRHOIDS 11/08/2007  . ALLERGIC RHINITIS 11/08/2007  . 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 caesarean section    . CHOLECYSTECTOMY N/A 05/21/2017   Procedure: LAPAROSCOPIC CHOLECYSTECTOMY;  Surgeon: Florene Glen, MD;  Location: [facility] ORS;  Service: General;  Laterality: N/A;  . COLONOSCOPY WITH PROPOFOL N/A 05/16/2017   Procedure: COLONOSCOPY WITH PROPOFOL;  Surgeon: Lin Landsman, MD;  Location: [facility] ENDOSCOPY;  Service: Gastroenterology;  Laterality: N/A;  . ESOPHAGOGASTRODUODENOSCOPY (EGD) WITH PROPOFOL N/A 05/16/2017   Procedure: ESOPHAGOGASTRODUODENOSCOPY (EGD) WITH PROPOFOL;  Surgeon: Lin Landsman, MD;  Location: [facility] ENDOSCOPY;  Service: Gastroenterology;  Laterality: N/A;  . htn     pulmonary edema with childbith, had cardiac w/ up  . SPIROMETRY  12/07   at allergist's- normal  . WISDOM TOOTH EXTRACTION      Prior to Admission medications  Medication Sig Start Date End Date Taking? Authorizing Provider  predniSONE (STERAPRED UNI-PAK 21 TAB) 10 MG (21) TBPK tablet Take 4 tablets (40 mg total) by mouth daily for 4 days, THEN 2 tablets (20 mg total) daily for 4 days, THEN 1 tablet (10 mg total) daily for 4 days. 04/15/20 04/27/20 Yes Vladimir Crofts, MD   albuterol (PROVENTIL HFA;VENTOLIN HFA) 108 (90 Base) MCG/ACT inhaler Inhale 2 puffs into the lungs every 6 (six) hours as needed for wheezing or shortness of breath. 02/26/17   Duanne Guess, PA-C  atenolol (TENORMIN) 25 MG tablet Take 12.5 mg by mouth daily.    [provider]  EPINEPHrine (EPIPEN 2-PAK) 0.3 mg/0.3 mL IJ SOAJ injection Inject 0.3 mg into the muscle once as needed.  11/14/13   [provider]  furosemide (LASIX) 20 MG tablet TAKE 1 TABLET BY MOUTH EVERY DAY IN THE MORNING 04/16/17   [provider]  mometasone (NASONEX) 50 MCG/ACT nasal spray Place 2 sprays into the nose daily as needed.     [provider]  Multiple Vitamins-Minerals (MULTIVITAL) tablet Take 1 tablet by mouth daily.      [provider]  omeprazole (PRILOSEC) 20 MG capsule TAKE 1 CAPSULE (20 MG TOTAL) BY MOUTH 2 (TWO) TIMES DAILY BEFORE A MEAL. Patient taking differently: Take 40 mg by mouth daily.  11/05/17 09/15/18  Lin Landsman, MD  Vitamin D, Ergocalciferol, (DRISDOL) 50000 UNITS CAPS Take 50,000 Units by mouth every 7 (seven) days.    [provider]    Allergies Cephalexin, Eggs or egg-derived products, Gluten meal, Lactase, Penicillins, and Zyrtec [cetirizine]  Family History  Problem Relation Age of Onset  . Hyperlipidemia Mother   . Hypertension Mother   . Hypertension Father   . Hyperlipidemia Father   . Breast cancer Maternal Aunt 51  . Breast cancer Maternal Grandmother 42    Social History Social History   Tobacco Use  . Smoking status: Never Smoker  . Smokeless tobacco: Never Used  Vaping Use  . Vaping Use: Never used  Substance Use Topics  . Alcohol use: Not Currently    Comment: occas  . Drug use: No    Review of Systems  Constitutional: No fever/chills Eyes: No visual changes. ENT: No sore throat. Cardiovascular: Denies chest pain. Respiratory: Denies shortness of breath.  Positive for nonproductive  cough. Gastrointestinal: No abdominal pain.  No nausea, no vomiting.  No diarrhea.  No constipation. Genitourinary: Negative for dysuria. Musculoskeletal: Negative for back pain. Skin: Negative for rash. Neurological: Negative for headaches, focal weakness or numbness.  ____________________________________________   PHYSICAL EXAM:  VITAL SIGNS: Vitals:   04/15/20 0132  BP: 117/66  Pulse: 83  Resp: 20  Temp: 98.1 F (36.7 C)  SpO2: 96%     Constitutional: Alert and oriented. Well appearing and in no acute distress.  Obese.  Conversational in full sentences, towards the end of our conversation I do note that she is coughing more. Eyes: Conjunctivae are normal. PERRL. EOMI. Head: Atraumatic. Nose: No congestion/rhinnorhea. Mouth/Throat: Mucous membranes are moist.  Oropharynx non-erythematous. Neck: No stridor. No cervical spine tenderness to palpation. Cardiovascular: Normal rate, regular rhythm. Grossly normal heart sounds.  Good peripheral circulation. Respiratory: Normal respiratory effort.  No retractions.  Faint and scattered expiratory wheezes throughout without focal features.  Good air movement throughout. Gastrointestinal: Soft , nondistended, nontender to palpation. No CVA tenderness. Musculoskeletal: No lower extremity tenderness nor edema.  No joint effusions. No signs of acute trauma.  Neurologic:  Normal speech and language. No gross focal neurologic deficits are appreciated. No gait instability noted. Skin:  Skin is warm, dry and intact. No rash noted. Psychiatric: Mood and affect are normal. Speech and behavior are normal.  ____________________________________________   LABS (all labs ordered are listed, but only abnormal results are displayed)  Labs Reviewed  SARS CORONAVIRUS 2 (TAT 6-24 HRS)   ____________________________________________  12 Lead EKG   ____________________________________________  RADIOLOGY  ED MD interpretation: View CXR reviewed  by me without evidence of acute cardiopulmonary pathology.  Official radiology report(s): No results found.  ____________________________________________   PROCEDURES and INTERVENTIONS  Procedure(s) performed (including Critical Care):  Procedures  Medications  ipratropium-albuterol (DUONEB) 0.5-2.5 (3) MG/3ML nebulizer solution 3 mL (3 mLs Nebulization Given 04/15/20 0227)  predniSONE (DELTASONE) tablet 60 mg (60 mg Oral Given 04/15/20 0227)    ____________________________________________   MDM / ED COURSE   55 year old woman with history of allergies presents to the ED with mild wheezing and nonproductive cough amenable to outpatient management with a steroid taper.  Normal vitals on room air.  Exam demonstrates a well-appearing woman who is speaking in full sentences, but develops nonproductive coughing throughout our conversation.  She has some diffuse expiratory wheezes, but no significant decrease in her air movement throughout.  Otherwise looks well without pathology.  Resolution of the symptoms after single DuoNeb.  CXR demonstrates no infiltrates to suggest CAP, or PTX.  No increased sputum production to suggest infectious source.  Started the patient on a prednisone taper.  Repeat Covid swab, at her request.  I see no barriers to outpatient management, and I see no indication for further work-up here in the ED.  We discussed return precautions for the ED prior to discharge.  Clinical Course as of 04/15/20 0250  Thu Apr 15, 2020  0248 Reassessed.  Patient reports resolution of symptoms after breathing treatment.  She is no longer coughing as we discussed her test results.  We discussed clear CXR.  We discussed prednisone taper as an outpatient and following up with her allergist and PCP.  We discussed return precautions for the ED. [DS]    Clinical Course User Index [DS] Vladimir Crofts, MD    ____________________________________________   FINAL CLINICAL IMPRESSION(S) / ED  DIAGNOSES  Final diagnoses:  Wheezing  Cough     ED Discharge Orders         Ordered    predniSONE (STERAPRED UNI-PAK 21 TAB) 10 MG (21) TBPK tablet        04/15/20 0243           Darrian Grzelak   Note:  This document was prepared using Dragon voice recognition software and may include unintentional dictation errors.   Vladimir Crofts, MD 04/15/20 [phone]

## 2020-04-15 NOTE — ED Triage Notes (Signed)
Patient ambulatory to triage with steady gait, without difficulty or distress noted; pt st "my daughter came in my room and woke me up because she said I was wheezing, so I need someone to listen to my lungs"; pt reports COVID in February; cont to have runny nose, congestion, nonprod cough, throat irritation and wheezing; televisit with allergist 3wks ago and placed on prednisone, pulmicort and albuterol; st once prednisone was completed symptoms returned

## 2020-04-15 NOTE — Discharge Instructions (Addendum)
Your Covid test is pending and will likely result in the morning.  Check your MyChart for this test result.  You are being discharged with a prescription for a prednisone taper over the next 12 days.  Take as prescribed and finish all of the tablets.  Follow-up with your PCP and continue to take your albuterol inhaler as needed every 4 hours.  Return to the ED with any worsening symptoms despite these medications, productive cough or fevers with your symptoms

## 2020-08-18 ENCOUNTER — Encounter: Payer: Self-pay | Admitting: Dermatology

## 2020-08-18 ENCOUNTER — Other Ambulatory Visit: Payer: Self-pay

## 2020-08-18 ENCOUNTER — Ambulatory Visit (INDEPENDENT_AMBULATORY_CARE_PROVIDER_SITE_OTHER): Payer: BC Managed Care – PPO | Admitting: Dermatology

## 2020-08-18 DIAGNOSIS — L821 Other seborrheic keratosis: Secondary | ICD-10-CM

## 2020-08-18 DIAGNOSIS — L820 Inflamed seborrheic keratosis: Secondary | ICD-10-CM

## 2020-08-18 DIAGNOSIS — L814 Other melanin hyperpigmentation: Secondary | ICD-10-CM | POA: Diagnosis not present

## 2020-08-18 DIAGNOSIS — D229 Melanocytic nevi, unspecified: Secondary | ICD-10-CM

## 2020-08-18 DIAGNOSIS — L578 Other skin changes due to chronic exposure to nonionizing radiation: Secondary | ICD-10-CM

## 2020-08-18 DIAGNOSIS — Z1283 Encounter for screening for malignant neoplasm of skin: Secondary | ICD-10-CM

## 2020-08-18 DIAGNOSIS — D1800 Hemangioma unspecified site: Secondary | ICD-10-CM

## 2020-08-18 NOTE — Patient Instructions (Signed)
Seborrheic Keratosis  What causes seborrheic keratoses? Seborrheic keratoses are harmless, common skin growths that first appear during adult life.  As time goes by, more growths appear.  Some people may develop a large number of them.  Seborrheic keratoses appear on both covered and uncovered body parts.  They are not caused by sunlight.  The tendency to develop seborrheic keratoses can be inherited.  They vary in color from skin-colored to gray, brown, or even black.  They can be either smooth or have a rough, warty surface.   Seborrheic keratoses are superficial and look as if they were stuck on the skin.  Under the microscope this type of keratosis looks like layers upon layers of skin.  That is why at times the top layer may seem to fall off, but the rest of the growth remains and re-grows.    TreatmentCryotherapy  Cryotherapy is the treatment of lesions with the application of a cold substance.  In most cases, liquid nitrogen is used to destroy the lesion(s).  Liquid nitrogen is so cold, -196 Celsius, it feels like it is burning when it is applied.  After treatment with liquid nitrogen, there may be some burning sensation or pain that can last up to 24 hours.  The area may also be swollen and red.  The discomfort can be relieved with ibuprofen (Advil, Motrin), acetaminophen (Extra Strength Tylenol), or similar pain relief medication.  Within 24 to 48 hours, a blister may form.  Occasionally, these blisters will become filled with blood and become very dark.  This is no cause for concern.  The blister will gradually dry up over a period of several days, eventually separating from the healing skin below in about one to two weeks.  The surrounding skin will become red and the area may become itchy.  This is all part of the normal healing process.  Occasionally, the crusts will last as long as four weeks when certain deeper spots on the skin are treated.  Sometimes a permanent white mark or scar will  be left after healing.  You may continue all of your normal activities as long as they do not cause pain in the treated areas.  It is okay to get the area wet.  After therapy or if the blister is still intact - You may treat it like normal skin.  Covering the area with a bandage may offer some comfort and protection from trauma but is not absolutely necessary.  If the blister is uncomfortable - Clean a small needle with rubbing alcohol, then gently make a small hole in the side of the blister to drain the blister fluid.  This often gives immediate relief.  Do not remove the blister roof, as the blister aids in healing.  In time it will fall off on its own.   Once the blister roof falls off or a sore forms -  Clean the blister sites with soap and water.  Rinse the area and pat dry.  Do not force off the blister roof or crust. Apply an antibiotic ointment such as Polysporin or Bacitracin. A bandage may be applied loosely over the blister until it is healed if desired. Call our office if you are concerned it may be infected.  Some redness, itching and oozing is part of the normal healing process.  Signs of infection include increasing redness, increasing pain, swelling, heat, or yellow discharge. Seborrheic keratoses do not need to be treated, but can easily be removed in the office.  Seborrheic keratoses often cause symptoms when they rub on clothing or jewelry.  Lesions can be in the way of shaving.  If they become inflamed, they can cause itching, soreness, or burning.  Removal of a seborrheic keratosis can be accomplished by freezing, burning, or surgery. If any spot bleeds, scabs, or grows rapidly, please return to have it checked, as these can be an indication of a skin cancer.  If you have any questions or concerns for your doctor, please call our main line at [phone] and press option 4 to reach your doctor's medical assistant. If no one answers, please leave a voicemail as directed and we  will return your call as soon as possible. Messages left after 4 pm will be answered the following business day.   You may also send [place] a message via [place]. We typically respond to MyChart messages within 1-2 business days.  For prescription refills, please ask your pharmacy to contact our office. Our fax number is [phone].  If you have an urgent issue when the clinic is closed that cannot wait until the next business day, you can page your doctor at the number below.    Please note that while we do our best to be available for urgent issues outside of office hours, we are not available 24/7.   If you have an urgent issue and are unable to reach [place], you may choose to seek medical care at your doctor's office, retail clinic, urgent care center, or emergency room.  If you have a medical emergency, please immediately call 911 or go to the emergency department.  Pager Numbers  - Dr. Nehemiah Massed: [phone]  - Dr. Laurence Ferrari: [phone]  - Dr. Nicole Kindred: [phone]  In the event of inclement weather, please call our main line at [phone] for an update on the status of any delays or closures.  Dermatology Medication Tips: Please keep the boxes that topical medications come in in order to help keep track of the instructions about where and how to use these. Pharmacies typically print the medication instructions only on the boxes and not directly on the medication tubes.   If your medication is too expensive, please contact our office at [phone] option 4 or send [place] a message through [place].   We are unable to tell what your co-pay for medications will be in advance as this is different depending on your insurance coverage. However, we may be able to find a substitute medication at lower cost or fill out paperwork to get insurance to cover a needed medication.   If a prior authorization is required to get your medication covered by your insurance company, please allow [place] 1-2  business days to complete this process.  Drug prices often vary depending on where the prescription is filled and some pharmacies may offer cheaper prices.  The website www.goodrx.com contains coupons for medications through different pharmacies. The prices here do not account for what the cost may be with help from insurance (it may be cheaper with your insurance), but the website can give you the price if you did not use any insurance.  - You can print the associated coupon and take it with your prescription to the pharmacy.  - You may also stop by our office during regular business hours and pick up a GoodRx coupon card.  - If you need your prescription sent electronically to a different pharmacy, notify our office through [facility] or by phone at [phone] option 4.

## 2020-08-18 NOTE — Progress Notes (Signed)
   New Patient Visit  Subjective  Grace [name] is a 55 y.o. female who presents for the following: Skin Problem ([place]. Itching at times. Sometimes rubbed by cothing/bra.).    Objective  Well appearing patient in no apparent distress; mood and affect are within normal limits.  Review of Systems: No other skin or systemic complaints except as noted in HPI or Assessment and Plan.   All sun exposed areas plus back examined.  left mid back at bra line x2 (2) Erythematous keratotic or waxy stuck-on papule or plaque.   Assessment & Plan   Lentigines - Scattered tan macules - Due to sun exposure - Benign-appering, observe - Recommend daily broad spectrum sunscreen SPF 30+ to sun-exposed areas, reapply every 2 hours as needed. - Call for any changes  Seborrheic Keratoses - Stuck-on, waxy, tan-brown papules and/or plaques  - Benign-appearing - Discussed benign etiology and prognosis. - Observe - Call for any changes  Melanocytic Nevi - Tan-brown and/or pink-flesh-colored symmetric macules and papules - Benign appearing on exam today - Observation - Call clinic for new or changing moles - Recommend daily use of broad spectrum spf 30+ sunscreen to sun-exposed areas.   Hemangiomas - Red papules - Discussed benign nature - Observe - Call for any changes  Actinic Damage - Chronic condition, secondary to cumulative UV/sun exposure - diffuse scaly erythematous macules with underlying dyspigmentation - Recommend daily broad spectrum sunscreen SPF 30+ to sun-exposed areas, reapply every 2 hours as needed.  - Staying in the shade or wearing long sleeves, sun glasses (UVA+UVB protection) and wide brim hats (4-inch brim around the entire circumference of the hat) are also recommended for sun protection.  - Call for new or changing lesions.  Skin cancer screening performed today.  Inflamed seborrheic keratosis left mid  back at bra line x2  Destruction of lesion - left mid back at bra line x2  Destruction method: cryotherapy   Informed consent: discussed and consent obtained   Lesion destroyed using liquid nitrogen: Yes   Region frozen until ice ball extended beyond lesion: Yes   Outcome: patient tolerated procedure well with no complications   Post-procedure details: wound care instructions given   Additional details:  Prior to procedure, discussed risks of blister formation, small wound, skin dyspigmentation, or rare scar following cryotherapy. Recommend Vaseline ointment to treated areas while healing.   Return if symptoms worsen or fail to improve.  I, Emelia Salisbury, CMA, am acting as scribe for Brendolyn Patty, MD.  Documentation: I have reviewed the above documentation for accuracy and completeness, and I agree with the above.  Brendolyn Patty MD

## 2020-09-29 DIAGNOSIS — J45909 Unspecified asthma, uncomplicated: Secondary | ICD-10-CM | POA: Insufficient documentation

## 2020-10-11 ENCOUNTER — Other Ambulatory Visit: Payer: Self-pay

## 2020-10-11 ENCOUNTER — Emergency Department
Admission: EM | Admit: 2020-10-11 | Discharge: 2020-10-11 | Disposition: A | Discharge status: Home / Self Care | Payer: BC Managed Care – PPO | Attending: Emergency Medicine | Admitting: Emergency Medicine

## 2020-10-11 ENCOUNTER — Emergency Department: Payer: BC Managed Care – PPO

## 2020-10-11 DIAGNOSIS — K219 Gastro-esophageal reflux disease without esophagitis: Secondary | ICD-10-CM | POA: Insufficient documentation

## 2020-10-11 DIAGNOSIS — R079 Chest pain, unspecified: Secondary | ICD-10-CM

## 2020-10-11 DIAGNOSIS — M546 Pain in thoracic spine: Secondary | ICD-10-CM | POA: Insufficient documentation

## 2020-10-11 DIAGNOSIS — R0600 Dyspnea, unspecified: Secondary | ICD-10-CM | POA: Insufficient documentation

## 2020-10-11 DIAGNOSIS — R110 Nausea: Secondary | ICD-10-CM | POA: Insufficient documentation

## 2020-10-11 DIAGNOSIS — R0789 Other chest pain: Secondary | ICD-10-CM | POA: Insufficient documentation

## 2020-10-11 LAB — CBC
HCT: 38.4 % (ref 36.0–46.0)
Hemoglobin: 12.5 g/dL (ref 12.0–15.0)
MCH: 28.7 pg (ref 26.0–34.0)
MCHC: 32.6 g/dL (ref 30.0–36.0)
MCV: 88.3 fL (ref 80.0–100.0)
Platelets: 260 10*3/uL (ref 150–400)
RBC: 4.35 MIL/uL (ref 3.87–5.11)
RDW: 15.2 % (ref 11.5–15.5)
WBC: 7.5 10*3/uL (ref 4.0–10.5)
nRBC: 0 % (ref 0.0–0.2)

## 2020-10-11 LAB — TROPONIN I (HIGH SENSITIVITY)
Troponin I (High Sensitivity): <2 ng/L (ref <18)
Troponin I (High Sensitivity): 3 ng/L (ref <18)

## 2020-10-11 LAB — BASIC METABOLIC PANEL
Anion gap: 10 (ref 5–15)
BUN: 14 mg/dL (ref 6–20)
CO2: 22 mmol/L (ref 22–32)
Calcium: 8.2 mg/dL — ABNORMAL LOW (ref 8.9–10.3)
Chloride: 109 mmol/L (ref 98–111)
Creatinine, Ser: 0.85 mg/dL (ref 0.44–1.00)
GFR, Estimated: >60 mL/min (ref >60)
Glucose, Bld: 133 mg/dL — ABNORMAL HIGH (ref 70–99)
Potassium: 3 mmol/L — ABNORMAL LOW (ref 3.5–5.1)
Sodium: 141 mmol/L (ref 135–145)

## 2020-10-11 LAB — POC URINE PREG, ED: Preg Test, Ur: NEGATIVE

## 2020-10-11 NOTE — Discharge Instructions (Signed)
Your blood work, and EKG were reassuring today that you did not have a heart attack.  We cannot say for sure that this was not related to your heart.  Please follow-up with a cardiologist for further evaluation.  If you have another episode of chest pain that does not improve, please return to the emergency department.

## 2020-10-11 NOTE — ED Notes (Signed)
See triage note presents with epigastric pain which radiates into mid back  states had some pain last pm  then eased off   but returned this am  states painfree at present

## 2020-10-11 NOTE — ED Provider Notes (Signed)
[facility]  ____________________________________________   Event Date/Time   First MD Initiated Contact with Patient 10/11/20 1222     (approximate)  I have reviewed the triage vital signs and the nursing notes.   HISTORY  Chief Complaint Chest Pain    HPI ALIZIA GREIF is a 55 y.o. female hypertension, obesity, GERD chest pain.  Patient notes that she had some upper back pain yesterday.  Then today around 9 AM she developed chest pain that was sharp in the center of her chest and radiating to her back.  She had associated nausea and dyspnea.  She has never had similar symptoms in the past.  She called EMS and was given nitroglycerin which relieved the pain.  Currently she is pain-free.  Denies any history of DVT/VTE.         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   Qualifier: Diagnosis of  By: Marcelino Scot CMA, [place] Bilberry     Allergic rhinitis    Bronchitis    Cardiomyopathy, peripartum, postpartum condition    THIS RESOLVED 6 MONTHS AFTER DELIVERY   GERD (gastroesophageal reflux disease) 05/07/2012   Heart valve regurgitation    Hx of dysplastic nevus 2019   multiple sites   HYPOGLYCEMIA 11/08/2007   Qualifier: Diagnosis of  By: Marcelino Scot CMA, [place] Bilberry     Other chest pain 09/14/2009   Qualifier: Diagnosis of  By: Rockey Situ MD, Tim      Patient Active Problem List   Diagnosis Date Noted   Biliary colic    Abdominal pain, chronic, epigastric    Encounter for screening colonoscopy    Allergic reaction to food 12/09/2013   Chronic idiopathic urticaria 12/09/2013   Rash and nonspecific skin eruption 12/09/2013   GERD (gastroesophageal reflux disease) 05/07/2012   HYPERLIPIDEMIA-MIXED 09/14/2009   DYSPNEA 09/14/2009   OTHER CHEST PAIN 09/14/2009   ONYCHIA AND PARONYCHIA OF FINGER 11/11/2007   HYPOGLYCEMIA 11/08/2007   HEMORRHOIDS 11/08/2007   ALLERGIC RHINITIS 11/08/2007   ACNE VULGARIS 11/08/2007    Past  Surgical History:  Procedure Laterality Date   caesarean section     CHOLECYSTECTOMY N/A 05/21/2017   Procedure: LAPAROSCOPIC CHOLECYSTECTOMY;  Surgeon: Florene Glen, MD;  Location: [facility] ORS;  Service: General;  Laterality: N/A;   COLONOSCOPY WITH PROPOFOL N/A 05/16/2017   Procedure: COLONOSCOPY WITH PROPOFOL;  Surgeon: Lin Landsman, MD;  Location: [facility] ENDOSCOPY;  Service: Gastroenterology;  Laterality: N/A;   ESOPHAGOGASTRODUODENOSCOPY (EGD) WITH PROPOFOL N/A 05/16/2017   Procedure: ESOPHAGOGASTRODUODENOSCOPY (EGD) WITH PROPOFOL;  Surgeon: Lin Landsman, MD;  Location: [facility] ENDOSCOPY;  Service: Gastroenterology;  Laterality: N/A;   htn     pulmonary edema with childbith, had cardiac w/ up   SPIROMETRY  12/07   at allergist's- normal   WISDOM TOOTH EXTRACTION      Prior to Admission medications   Medication Sig Start Date End Date Taking? Authorizing Provider  albuterol (PROVENTIL HFA;VENTOLIN HFA) 108 (90 Base) MCG/ACT inhaler Inhale 2 puffs into the lungs every 6 (six) hours as needed for wheezing or shortness of breath. 02/26/17   Duanne Guess, PA-C  atenolol (TENORMIN) 25 MG tablet Take 12.5 mg by mouth daily.    [provider]  EPINEPHrine (EPIPEN 2-PAK) 0.3 mg/0.3 mL IJ SOAJ injection Inject 0.3 mg into the muscle once as needed.  11/14/13   [provider]  furosemide (LASIX) 20 MG tablet TAKE 1 TABLET BY MOUTH EVERY DAY IN THE MORNING  04/16/17   [provider]  mometasone (NASONEX) 50 MCG/ACT nasal spray Place 2 sprays into the nose daily as needed.     [provider]  Multiple Vitamins-Minerals (MULTIVITAL) tablet Take 1 tablet by mouth daily.      [provider]  omeprazole (PRILOSEC) 20 MG capsule TAKE 1 CAPSULE (20 MG TOTAL) BY MOUTH 2 (TWO) TIMES DAILY BEFORE A MEAL. Patient taking differently: Take 40 mg by mouth daily.  11/05/17 09/15/18  Lin Landsman, MD  Vitamin D, Ergocalciferol, (DRISDOL) 50000 UNITS  CAPS Take 50,000 Units by mouth every 7 (seven) days.    [provider]    Allergies Antihistamines, diphenhydramine-type; Cephalexin; Eggs or egg-derived products; Gluten meal; Lactase; Penicillins; and Zyrtec [cetirizine]  Family History  Problem Relation Age of Onset   Hyperlipidemia Mother    Hypertension Mother    Hypertension Father    Hyperlipidemia Father    Breast cancer Maternal Aunt 51   Breast cancer Maternal Grandmother 51    Social History Social History   Tobacco Use   Smoking status: Never   Smokeless tobacco: Never  Vaping Use   Vaping Use: Never used  Substance Use Topics   Alcohol use: Not Currently    Comment: occas   Drug use: No    Review of Systems   Review of Systems  Constitutional:  Negative for chills and fever.  Respiratory:  Positive for shortness of breath. Negative for cough.   Cardiovascular:  Positive for chest pain. Negative for palpitations and leg swelling.  Gastrointestinal:  Positive for nausea. Negative for abdominal pain and vomiting.  Musculoskeletal:  Positive for back pain.  All other systems reviewed and are negative.  Physical Exam Updated Vital Signs BP 120/60 (BP Location: Right Arm)   Pulse 60   Temp 98.2 F (36.8 C) (Oral)   Resp 16   Ht 5\' 1"  (1.549 m)   Wt 99.8 kg   LMP 05/18/2015 (Approximate)   SpO2 97%   BMI 41.57 kg/m   Physical Exam Vitals and nursing note reviewed.  Constitutional:      General: She is not in acute distress.    Appearance: Normal appearance.  HENT:     Head: Normocephalic and atraumatic.  Eyes:     General: No scleral icterus.    Conjunctiva/sclera: Conjunctivae normal.  Pulmonary:     Effort: Pulmonary effort is normal. No respiratory distress.     Breath sounds: No stridor.  Abdominal:     Palpations: Abdomen is soft.     Tenderness: There is no abdominal tenderness. There is no guarding or rebound.  Musculoskeletal:        General: No deformity or signs of  injury.     Cervical back: Normal range of motion.  Skin:    General: Skin is dry.     Coloration: Skin is not jaundiced or pale.  Neurological:     General: No focal deficit present.     Mental Status: She is alert and oriented to person, place, and time. Mental status is at baseline.  Psychiatric:        Mood and Affect: Mood normal.        Behavior: Behavior normal.     LABS (all labs ordered are listed, but only abnormal results are displayed)  Labs Reviewed  BASIC METABOLIC PANEL - Abnormal; Notable for the following components:      Result Value   Potassium 3.0 (*)    Glucose, Bld 133 (*)  Calcium 8.2 (*)    All other components within normal limits  CBC  POC URINE PREG, ED  TROPONIN I (HIGH SENSITIVITY)  TROPONIN I (HIGH SENSITIVITY)  TROPONIN I (HIGH SENSITIVITY)   ____________________________________________  EKG  Incomplete right bundle branch block, normal axis, T wave inversions in V2 and V3, similar to prior ____________________________________________  RADIOLOGY I, Madelin Headings, personally viewed and evaluated these images (plain radiographs) as part of my medical decision making, as well as reviewing the written report by the radiologist.  ED MD interpretation: Reviewed the chest x-ray which does not show any acute cardiopulmonary process    ____________________________________________   PROCEDURES  Procedure(s) performed (including Critical Care):  Procedures   ____________________________________________   INITIAL IMPRESSION / ASSESSMENT AND PLAN / ED COURSE     55 year old female presents with an episode of chest pain that started around 9 AM today.  Pain resolved with nitroglycerin during EMS transport.  Episode is somewhat concerning and associated shortness of breath and nausea and relieved by nitro.  Her EKG here does have some T wave inversions in the anterior precordial leads however this is unchanged from prior.  Her vital  signs within normal limits and she is very well-appearing.  Her initial troponin is negative however will send a repeat as this was before 3 hours.  If negative we will have her follow-up with cardiology as this episode was concerning.  Repeat troponin is still undetectable.  Patient remains pain-free.  Given she is pain-free and her troponins are negative with a normal EKG do not feel that she warrants inpatient evaluation at this time.  I explained to her that we have ruled out MI but still cannot say for sure that this was not related to her heart.  I advised that she follow-up with cardiology for potential work-up.  We discussed return precautions for worsening pain that does not resolve.   ____________________________________________   FINAL CLINICAL IMPRESSION(S) / ED DIAGNOSES  Final diagnoses:  Chest pain, unspecified type     ED Discharge Orders     None        Note:  This document was prepared using Dragon voice recognition software and may include unintentional dictation errors.    Rada Hay, MD 10/11/20 [phone]

## 2020-10-11 NOTE — ED Triage Notes (Addendum)
Pt here with CP via [facility] that started an hour ago. Pt was center in nature and radiates to her back, Pt states that she had back pain yesterday as well. Pt states pain is sharp and consistent. Pt given 324 of ASA and 2 nitros.

## 2020-10-14 ENCOUNTER — Ambulatory Visit: Payer: BC Managed Care – PPO

## 2020-10-14 ENCOUNTER — Other Ambulatory Visit: Payer: Self-pay

## 2020-10-14 ENCOUNTER — Other Ambulatory Visit: Payer: BC Managed Care – PPO

## 2020-10-14 ENCOUNTER — Ambulatory Visit
Admission: RE | Admit: 2020-10-14 | Discharge: 2020-10-14 | Disposition: A | Discharge status: Home / Self Care | Payer: BC Managed Care – PPO | Source: Ambulatory Visit | Attending: Obstetrics and Gynecology | Admitting: Obstetrics and Gynecology

## 2020-10-14 DIAGNOSIS — N6001 Solitary cyst of right breast: Secondary | ICD-10-CM

## 2020-10-19 ENCOUNTER — Ambulatory Visit (INDEPENDENT_AMBULATORY_CARE_PROVIDER_SITE_OTHER): Payer: BC Managed Care – PPO | Admitting: Gastroenterology

## 2020-10-19 ENCOUNTER — Encounter: Payer: Self-pay | Admitting: Gastroenterology

## 2020-10-19 ENCOUNTER — Other Ambulatory Visit: Payer: Self-pay

## 2020-10-19 VITALS — BP 127/74 | HR 58 | Temp 98.2°F | Ht 61.0 in | Wt 226.8 lb

## 2020-10-19 DIAGNOSIS — R635 Abnormal weight gain: Secondary | ICD-10-CM | POA: Diagnosis not present

## 2020-10-19 DIAGNOSIS — H1045 Other chronic allergic conjunctivitis: Secondary | ICD-10-CM | POA: Insufficient documentation

## 2020-10-19 DIAGNOSIS — J301 Allergic rhinitis due to pollen: Secondary | ICD-10-CM | POA: Insufficient documentation

## 2020-10-19 DIAGNOSIS — J4520 Mild intermittent asthma, uncomplicated: Secondary | ICD-10-CM | POA: Insufficient documentation

## 2020-10-19 DIAGNOSIS — K580 Irritable bowel syndrome with diarrhea: Secondary | ICD-10-CM | POA: Diagnosis not present

## 2020-10-19 DIAGNOSIS — J4541 Moderate persistent asthma with (acute) exacerbation: Secondary | ICD-10-CM | POA: Insufficient documentation

## 2020-10-19 DIAGNOSIS — J3081 Allergic rhinitis due to animal (cat) (dog) hair and dander: Secondary | ICD-10-CM | POA: Insufficient documentation

## 2020-10-19 DIAGNOSIS — K219 Gastro-esophageal reflux disease without esophagitis: Secondary | ICD-10-CM

## 2020-10-19 MED ORDER — OMEPRAZOLE 40 MG PO CPDR: 40.0000 mg | DELAYED_RELEASE_CAPSULE | Freq: Two times a day (BID) | ORAL | 2 refills | Status: DC

## 2020-10-19 NOTE — Progress Notes (Signed)
Grace Darby, MD [address]  [place]  [place], [place] [address]  Main: [phone]  Fax: [phone]    Gastroenterology Consultation  Referring Provider:     Lenard Simmer, MD Primary Care Physician:  Grace Simmer, MD Primary Gastroenterologist:  Dr. Cephas [name] Reason for Consultation:     Chronic GERD, flareup, weight gain        HPI:   Grace [name] is a 55 y.o. female referred by Dr. Ronnald Collum, Grace Sledge, MD  for consultation & management of recent exacerbation of chronic GERD symptoms.  Patient reports that for last 1 year, she has been having increasing symptoms of reflux, frequent burping, substernal chest pain associated with severe abdominal bloating, postprandial urgency and loose stools.  Patient was on Ozempic which led to 15 pound weight loss last year.  Since she does not have diabetes, it is no longer covered by her insurance for weight loss.  Patient reports that she has been following strict 1200-calorie diet for the last 1 year, she does have rice and potato for dinner. However, she is not seeing any loss of weight and she appears to be frustrated.  She does walk about 30 minutes daily.  She had menopause at age 89 and is on estrogen replacement.  Her TSH is normal. Labs are unremarkable  Patient also reports that she is diagnosed with several food allergies as well as she has been experiencing shortness of breath.  She was told that her shortness of breath is secondary to asthma attacks from uncontrolled reflux  NSAIDs: None  Antiplts/Anticoagulants/Anti thrombotics: None  GI Procedures:  EGD 05/16/17 - Normal duodenal bulb and second portion of the duodenum. - Normal stomach. Biopsied. - Normal gastroesophageal junction and esophagus.   Colonoscopy 05/16/2017 - One 5 mm polyp in the rectum, removed with a cold snare. Resected and retrieved. - The entire examined colon is normal. - The distal rectum and anal verge are normal on  retroflexion view. DIAGNOSIS:  A.  STOMACH; COLD BIOPSY:  - UNREMARKABLE ANTRAL MUCOSA.  - OXYNTIC MUCOSA WITH CHANGES CONSISTENT WITH PROTON PUMP INHIBITOR USE.  - NEGATIVE FOR H. PYLORI, DYSPLASIA, AND MALIGNANCY.   B.  RECTUM POLYP; COLD SNARE:  - HYPERPLASTIC POLYP.  - NEGATIVE FOR DYSPLASIA AND MALIGNANCY  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   Qualifier: Diagnosis of  By: Marcelino Scot CMA, [place] Bilberry     Allergic rhinitis    Biliary colic    Bronchitis    Cardiomyopathy, peripartum, postpartum condition    THIS RESOLVED 6 MONTHS AFTER DELIVERY   GERD (gastroesophageal reflux disease) 05/07/2012   Heart valve regurgitation    Hx of dysplastic nevus 2019   multiple sites   HYPOGLYCEMIA 11/08/2007   Qualifier: Diagnosis of  By: Marcelino Scot CMA, [place] Bilberry     Other chest pain 09/14/2009   Qualifier: Diagnosis of  By: Rockey Situ MD, Tim      Past Surgical History:  Procedure Laterality Date   caesarean section     CHOLECYSTECTOMY N/A 05/21/2017   Procedure: LAPAROSCOPIC CHOLECYSTECTOMY;  Surgeon: Florene Glen, MD;  Location: [facility] ORS;  Service: General;  Laterality: N/A;   COLONOSCOPY WITH PROPOFOL N/A 05/16/2017   Procedure: COLONOSCOPY WITH PROPOFOL;  Surgeon: Lin Landsman, MD;  Location: [facility] ENDOSCOPY;  Service: Gastroenterology;  Laterality: N/A;   ESOPHAGOGASTRODUODENOSCOPY (EGD) WITH PROPOFOL N/A 05/16/2017   Procedure: ESOPHAGOGASTRODUODENOSCOPY (EGD) WITH PROPOFOL;  Surgeon: Sherri Sear  Reece Levy, MD;  Location: [facility];  Service: Gastroenterology;  Laterality: N/A;   htn     pulmonary edema with childbith, had cardiac w/ up   SPIROMETRY  12/07   at allergist's- normal   WISDOM TOOTH EXTRACTION     Current Outpatient Medications:    albuterol (PROVENTIL HFA;VENTOLIN HFA) 108 (90 Base) MCG/ACT inhaler, Inhale 2 puffs into the lungs every 6 (six) hours as needed for wheezing or shortness of breath., Disp: 1 Inhaler, Rfl: 2    ALPRAZolam (XANAX) 0.5 MG tablet, alprazolam 0.5 mg tablet  TAKE 1 TABLET TWICE A DAY AS NEEDED, Disp: , Rfl:    B Complex Vitamins (VITAMIN B COMPLEX) TABS, See admin instructions., Disp: , Rfl:    budesonide-formoterol (SYMBICORT) 80-4.5 MCG/ACT inhaler, SMARTSIG:2 Puff(s) By Mouth Twice Daily, Disp: , Rfl:    EPINEPHrine 0.3 mg/0.3 mL IJ SOAJ injection, Inject 0.3 mg into the muscle once as needed. , Disp: , Rfl:    Estradiol 10 MCG TABS vaginal tablet, Imvexxy Maintenance Pack 10 mcg vaginal insert  INSERT 1 CAPSULE VAGINALLY TWICE WEEKLY, Disp: , Rfl:    furosemide (LASIX) 20 MG tablet, TAKE 1 TABLET BY MOUTH EVERY DAY IN THE MORNING, Disp: , Rfl: 5   metFORMIN (GLUCOPHAGE) 500 MG tablet, Take 500 mg by mouth 2 (two) times daily., Disp: , Rfl:    mometasone (NASONEX) 50 MCG/ACT nasal spray, Place into the nose., Disp: , Rfl:    Multiple Vitamins-Minerals (MULTIVITAL) tablet, Take 1 tablet by mouth daily.  , Disp: , Rfl:    Olopatadine HCl 0.2 % SOLN, 1 drop into affected eye, Disp: , Rfl:    omeprazole (PRILOSEC) 40 MG capsule, 1 capsule 30 minutes before morning meal, Disp: , Rfl:    omeprazole (PRILOSEC) 40 MG capsule, Take 1 capsule (40 mg total) by mouth 2 (two) times daily before a meal., Disp: 60 capsule, Rfl: 2   sucralfate (CARAFATE) 1 g tablet, Take 1 g by mouth 2 (two) times daily., Disp: , Rfl:    Vitamin D, Ergocalciferol, (DRISDOL) 50000 UNITS CAPS, Take 50,000 Units by mouth every 7 (seven) days., Disp: , Rfl:     Family History  Problem Relation Age of Onset   Hyperlipidemia Mother    Hypertension Mother    Hypertension Father    Hyperlipidemia Father    Breast cancer Maternal Aunt 51   Breast cancer Maternal Grandmother 51     Social History   Tobacco Use   Smoking status: Never   Smokeless tobacco: Never  Vaping Use   Vaping Use: Never used  Substance Use Topics   Alcohol use: Not Currently    Comment: occas   Drug use: No    Allergies as of 10/19/2020 -  Review Complete 10/11/2020  Allergen Reaction Noted   Antihistamines, diphenhydramine-type  08/18/2020   Aspirin Other (See Comments) 09/10/2017   Cephalexin     Cetirizine Hives and Other (See Comments) 03/12/2012   Dramamine [dimenhydrinate] Other (See Comments) 09/17/2020   Eggs or egg-derived products Itching 01/30/2014   Gluten meal Swelling 01/30/2014   Lactase Itching 01/30/2014   Meclizine Other (See Comments) 10/19/2020   Penicillins      Review of Systems:    All systems reviewed and negative except where noted in HPI.   Physical Exam:  BP 127/74 (BP Location: Right Arm, Patient Position: Sitting, Cuff Size: Normal)   Pulse (!) 58   Temp 98.2 F (36.8 C) (Oral)   Ht 5\' 1"  (1.549  m)   Wt 226 lb 12.8 oz (102.9 kg)   LMP 05/18/2015 (Approximate)   BMI 42.85 kg/m  Patient's last menstrual period was 05/18/2015 (approximate).  General:   Alert,  Well-developed, well-nourished, pleasant and cooperative in NAD Head:  Normocephalic and atraumatic. Eyes:  Sclera clear, no icterus.   Conjunctiva pink. Ears:  Normal auditory acuity. Nose:  No deformity, discharge, or lesions. Mouth:  No deformity or lesions,oropharynx pink & moist. Neck:  Supple; no masses or thyromegaly. Lungs:  Respirations even and unlabored.  Clear throughout to auscultation.   No wheezes, crackles, or rhonchi. No acute distress. Heart:  Regular rate and rhythm; no murmurs, clicks, rubs, or gallops. Abdomen:  Normal bowel sounds. Soft, non-tender and moderately distended, tympanic to percussion without masses, hepatosplenomegaly or hernias noted.  No guarding or rebound tenderness.   Rectal: Not performed Msk:  Symmetrical without gross deformities. Good, equal movement & strength bilaterally. Pulses:  Normal pulses noted. Extremities:  No clubbing or edema.  No cyanosis. Neurologic:  Alert and oriented x3;  grossly normal neurologically. Skin:  Intact without significant lesions or rashes. No  jaundice. Psych:  Alert and cooperative. Normal mood and affect.  Imaging Studies: Reviewed  Assessment and Plan:   Grace [name] is a 55 y.o. pleasant Caucasian female with BMI 42.85, chronic GERD is seen in consultation for exacerbation of reflux as well as weight gain, abdominal bloating, postprandial urgency and loose stools.  Patient does not have any constitutional symptoms  Exacerbation of chronic GERD Likely secondary to weight gain Start omeprazole 40 mg p.o. 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 pylori breath test Celiac disease panel Trial of lactose-free diet If these tests are negative, recommend pancreatic fecal elastase levels  Weight gain after menopause TSH normal Discussed with her dietary management as well as exercise Advised her to cut back on simple carbohydrates, gave her tips on complex carbs and protein diet   Follow up in 4 months   Grace Darby, MD

## 2020-10-20 DIAGNOSIS — O903 Peripartum cardiomyopathy: Secondary | ICD-10-CM | POA: Insufficient documentation

## 2020-10-21 LAB — H. PYLORI BREATH TEST: H pylori Breath Test: NEGATIVE

## 2020-10-22 LAB — CELIAC DISEASE PANEL
Endomysial IgA: NEGATIVE
IgA/Immunoglobulin A, Serum: 544 mg/dL — ABNORMAL HIGH (ref 87–352)
Transglutaminase IgA: <2 U/mL (ref 0–3)

## 2020-10-25 ENCOUNTER — Encounter: Payer: Self-pay | Admitting: Gastroenterology

## 2020-10-29 ENCOUNTER — Telehealth: Payer: Self-pay

## 2020-10-29 NOTE — Telephone Encounter (Signed)
Tried to call patient to reschedule patient EGD on 11/16/2020. Patient voicemail is not set up so could not leave a message

## 2020-11-01 NOTE — Telephone Encounter (Signed)
Tried to call patient but mailbox is full sent [employer]

## 2020-11-02 NOTE — Telephone Encounter (Signed)
Tried to call patient but mailbox is full  

## 2020-11-03 NOTE — Telephone Encounter (Signed)
Tried to call patient but mailbox is full  

## 2020-11-08 ENCOUNTER — Other Ambulatory Visit: Payer: Self-pay

## 2020-11-08 DIAGNOSIS — K219 Gastro-esophageal reflux disease without esophagitis: Secondary | ICD-10-CM

## 2020-11-08 NOTE — Telephone Encounter (Signed)
Tried to call patient but voicemail is full  

## 2020-11-08 NOTE — Telephone Encounter (Signed)
Called and reschedule to 12/06/20

## 2020-11-16 ENCOUNTER — Encounter: Admission: RE | Payer: Self-pay | Source: Ambulatory Visit

## 2020-11-16 ENCOUNTER — Ambulatory Visit
Admission: RE | Admit: 2020-11-16 | Payer: BC Managed Care – PPO | Source: Ambulatory Visit | Admitting: Gastroenterology

## 2020-11-16 SURGERY — ESOPHAGOGASTRODUODENOSCOPY (EGD) WITH PROPOFOL
Anesthesia: General

## 2020-12-03 ENCOUNTER — Encounter: Payer: Self-pay | Admitting: Gastroenterology

## 2020-12-06 ENCOUNTER — Ambulatory Visit
Admission: RE | Admit: 2020-12-06 | Payer: BC Managed Care – PPO | Source: Home / Self Care | Admitting: Gastroenterology

## 2020-12-06 ENCOUNTER — Encounter: Admission: RE | Payer: Self-pay | Source: Home / Self Care

## 2020-12-06 HISTORY — DX: Type 2 diabetes mellitus without complications: E11.9

## 2020-12-06 SURGERY — ESOPHAGOGASTRODUODENOSCOPY (EGD) WITH PROPOFOL
Anesthesia: General

## 2020-12-08 ENCOUNTER — Telehealth: Payer: Self-pay | Admitting: Gastroenterology

## 2020-12-08 NOTE — Telephone Encounter (Signed)
Inbound call from pt requesting to schedule a EGD. Pt stated she's available to talk after 11 am today

## 2020-12-09 ENCOUNTER — Telehealth: Payer: Self-pay

## 2020-12-09 NOTE — Telephone Encounter (Signed)
Called patient no answer no way to leave voicemail her mailbox was full

## 2020-12-09 NOTE — Telephone Encounter (Signed)
CALLED PATIENT NO ANSWER AND MAILBOX WAS FULL SO WAS NOT ABLE TO LEAVE A MESSAGE

## 2020-12-13 ENCOUNTER — Telehealth: Payer: Self-pay

## 2020-12-13 NOTE — Telephone Encounter (Signed)
Called patient no answer no way to leave voicemail mailbox was full

## 2021-01-28 ENCOUNTER — Ambulatory Visit
Admission: RE | Admit: 2021-01-28 | Discharge: 2021-01-28 | Disposition: A | Discharge status: Home / Self Care | Payer: BC Managed Care – PPO | Source: Ambulatory Visit | Attending: Family Medicine | Admitting: Family Medicine

## 2021-01-28 VITALS — BP 130/79 | HR 63 | Temp 98.5°F | Resp 16

## 2021-01-28 DIAGNOSIS — J209 Acute bronchitis, unspecified: Secondary | ICD-10-CM

## 2021-01-28 DIAGNOSIS — J019 Acute sinusitis, unspecified: Secondary | ICD-10-CM | POA: Diagnosis not present

## 2021-01-28 MED ORDER — PREDNISONE 20 MG PO TABS: 40.0000 mg | ORAL_TABLET | Freq: Every day | ORAL | 0 refills | Status: DC

## 2021-01-28 MED ORDER — AZITHROMYCIN 250 MG PO TABS: ORAL_TABLET | ORAL | 0 refills | Status: DC

## 2021-01-28 NOTE — ED Triage Notes (Signed)
Pt here with nasal congestion, sore throat and cough since 12/23. Pt states it has continued to get worse and now there is burning in her chest when she coughs.

## 2021-01-28 NOTE — ED Provider Notes (Signed)
[name]    CSN: [id number] Arrival date & time: 01/28/21  1656      History   Chief Complaint Chief Complaint  Patient presents with   Nasal Congestion   Sore Throat   Cough    HPI Grace [name] is a 56 y.o. female.   HPI Patient presents today with 3 weeks history of sinus congestion, ear pressure, post nasal drainage, throat irritation, and chest burning and tenderness with coughing. She chronically administer her inhalers and takes antihistamines regularly. She has attempted relief with all conservative measures without significant improvement of symptoms.  She has taken two COVID test both of which were negative.  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   Qualifier: Diagnosis of  By: Marcelino Scot CMA, [place] Bilberry     Allergic rhinitis    Biliary colic    Bronchitis    Cardiomyopathy, peripartum, postpartum condition    THIS RESOLVED 6 MONTHS AFTER DELIVERY   Diabetes mellitus without complication (HCC)    GERD (gastroesophageal reflux disease) 05/07/2012   Heart valve regurgitation    Hx of dysplastic nevus 2019   multiple sites   HYPOGLYCEMIA 11/08/2007   Qualifier: Diagnosis of  By: Marcelino Scot CMA, [place] Bilberry     Other chest pain 09/14/2009   Qualifier: Diagnosis of  By: Rockey Situ MD, Tim      Patient Active Problem List   Diagnosis Date Noted   Allergic rhinitis due to animal (cat) (dog) hair and dander 10/19/2020   Allergic rhinitis due to pollen 10/19/2020   Chronic allergic conjunctivitis 10/19/2020   Mild intermittent asthma 10/19/2020   Moderate persistent asthma with (acute) exacerbation 10/19/2020   Asthma 09/29/2020   Abdominal pain, chronic, epigastric    Allergic reaction to food 12/09/2013   Chronic idiopathic urticaria 12/09/2013   Rash and nonspecific skin eruption 12/09/2013   GERD (gastroesophageal reflux disease) 05/07/2012   HYPERLIPIDEMIA-MIXED 09/14/2009   DYSPNEA 09/14/2009   OTHER CHEST PAIN  09/14/2009   ONYCHIA AND PARONYCHIA OF FINGER 11/11/2007   HYPOGLYCEMIA 11/08/2007   HEMORRHOIDS 11/08/2007   ALLERGIC RHINITIS 11/08/2007   ACNE VULGARIS 11/08/2007    Past Surgical History:  Procedure Laterality Date   caesarean section     CHOLECYSTECTOMY N/A 05/21/2017   Procedure: LAPAROSCOPIC CHOLECYSTECTOMY;  Surgeon: Florene Glen, MD;  Location: [facility] ORS;  Service: General;  Laterality: N/A;   COLONOSCOPY WITH PROPOFOL N/A 05/16/2017   Procedure: COLONOSCOPY WITH PROPOFOL;  Surgeon: Lin Landsman, MD;  Location: [facility] ENDOSCOPY;  Service: Gastroenterology;  Laterality: N/A;   ESOPHAGOGASTRODUODENOSCOPY (EGD) WITH PROPOFOL N/A 05/16/2017   Procedure: ESOPHAGOGASTRODUODENOSCOPY (EGD) WITH PROPOFOL;  Surgeon: Lin Landsman, MD;  Location: [facility] ENDOSCOPY;  Service: Gastroenterology;  Laterality: N/A;   htn     pulmonary edema with childbith, had cardiac w/ up   SPIROMETRY  12/07   at allergist's- normal   WISDOM TOOTH EXTRACTION      OB History   No obstetric history on file.      Home Medications    Prior to Admission medications   Medication Sig Start Date End Date Taking? Authorizing Provider  azithromycin (ZITHROMAX) 250 MG tablet Take 2 tabs PO x 1 dose, then 1 tab PO QD x 4 days 01/28/21  Yes Scot Jun, FNP  predniSONE (DELTASONE) 20 MG tablet Take 2 tablets (40 mg total) by mouth daily with breakfast. 01/28/21  Yes Scot Jun, FNP  albuterol (PROVENTIL HFA;VENTOLIN HFA) 108 (90  Base) MCG/ACT inhaler Inhale 2 puffs into the lungs every 6 (six) hours as needed for wheezing or shortness of breath. 02/26/17   Duanne Guess, PA-C  ALPRAZolam Duanne Moron) 0.5 MG tablet alprazolam 0.5 mg tablet  TAKE 1 TABLET TWICE A DAY AS NEEDED    [provider]  B Complex Vitamins (VITAMIN B COMPLEX) TABS See admin instructions.    [provider]  budesonide-formoterol (SYMBICORT) 80-4.5 MCG/ACT inhaler SMARTSIG:2 Puff(s) By Mouth Twice Daily  08/06/20   [provider]  EPINEPHrine 0.3 mg/0.3 mL IJ SOAJ injection Inject 0.3 mg into the muscle once as needed.  11/14/13   [provider]  Estradiol 10 MCG TABS vaginal tablet Imvexxy Maintenance Pack 10 mcg vaginal insert  INSERT 1 CAPSULE VAGINALLY TWICE WEEKLY    [provider]  furosemide (LASIX) 20 MG tablet TAKE 1 TABLET BY MOUTH EVERY DAY IN THE MORNING 04/16/17   [provider]  metFORMIN (GLUCOPHAGE) 500 MG tablet Take 500 mg by mouth 2 (two) times daily. 06/21/20   [provider]  mometasone (NASONEX) 50 MCG/ACT nasal spray Place into the nose.    [provider]  Multiple Vitamins-Minerals (MULTIVITAL) tablet Take 1 tablet by mouth daily.      [provider]  Olopatadine HCl 0.2 % SOLN 1 drop into affected eye    [provider]  omeprazole (PRILOSEC) 40 MG capsule 1 capsule 30 minutes before morning meal    [provider]  omeprazole (PRILOSEC) 40 MG capsule Take 1 capsule (40 mg total) by mouth 2 (two) times daily before a meal. 10/19/20 11/18/20  Vanga, Tally Due, MD  sucralfate (CARAFATE) 1 g tablet Take 1 g by mouth 2 (two) times daily. 05/25/20   [provider]  Vitamin D, Ergocalciferol, (DRISDOL) 50000 UNITS CAPS Take 50,000 Units by mouth every 7 (seven) days.    [provider]    Family History Family History  Problem Relation Age of Onset   Hyperlipidemia Mother    Hypertension Mother    Hypertension Father    Hyperlipidemia Father    Breast cancer Maternal Aunt 51   Breast cancer Maternal Grandmother 51    Social History Social History   Tobacco Use   Smoking status: Never   Smokeless tobacco: Never  Vaping Use   Vaping Use: Never used  Substance Use Topics   Alcohol use: Not Currently    Comment: occas   Drug use: No     Allergies   Antihistamines, diphenhydramine-type; Aspirin; Cephalexin; Cetirizine; Dramamine [dimenhydrinate]; Eggs or  egg-derived products; Gluten meal; Meclizine; Penicillins; and Tilactase   Review of Systems Review of Systems   Physical Exam Triage Vital Signs ED Triage Vitals  Enc Vitals Group     BP 01/28/21 1717 130/79     Pulse Rate 01/28/21 1717 63     Resp 01/28/21 1717 16     Temp 01/28/21 1717 98.5 F (36.9 C)     Temp Source 01/28/21 1717 Oral     SpO2 01/28/21 1717 98 %     Weight --      Height --      Head Circumference --      Peak Flow --      Pain Score 01/28/21 1725 0     Pain Loc --      Pain Edu? --      Excl. in [place]? --    No data found.  Updated Vital Signs BP 130/79 (  BP Location: Left Arm)    Pulse 63    Temp 98.5 F (36.9 C) (Oral)    Resp 16    LMP 05/18/2015 (Approximate)    SpO2 98%   Visual Acuity Right Eye Distance:   Left Eye Distance:   Bilateral Distance:    Right Eye Near:   Left Eye Near:    Bilateral Near:     Physical Exam  General Appearance:    Alert, cooperative, no distress  HENT:   Normocephalic, ears normal, nares mucosal edema with congestion, rhinorrhea, oropharynx    Eyes:    PERRL, conjunctiva/corneas clear, EOM's intact       Lungs:     Coarse lung sound to auscultation bilaterally, respirations unlabored  Heart:    Regular rate and rhythm  Neurologic:   Awake, alert, oriented x 3. No apparent focal neurological           defect.      UC Treatments / Results  Labs (all labs ordered are listed, but only abnormal results are displayed) Labs Reviewed - No data to display  EKG   Radiology No results found.  Procedures Procedures (including critical care time)  Medications Ordered in UC Medications - No data to display  Initial Impression / Assessment and Plan / UC Course  I have reviewed the triage vital signs and the nursing notes.  Pertinent labs & imaging results that were available during my care of the patient were reviewed by me and considered in my medical decision making (see chart for details).    Acute  non-recurrent sinusitis Acute Bronchitis  Start Prednisone and Azithromycin.  Continue Inhaler treatments and antihistamines. RTC PRN  Final Clinical Impressions(s) / UC Diagnoses   Final diagnoses:  Acute non-recurrent sinusitis, unspecified location  Acute bronchitis, unspecified organism   Discharge Instructions   None    ED Prescriptions     Medication Sig Dispense Auth. Provider   predniSONE (DELTASONE) 20 MG tablet Take 2 tablets (40 mg total) by mouth daily with breakfast. 10 tablet Scot Jun, FNP   azithromycin (ZITHROMAX) 250 MG tablet Take 2 tabs PO x 1 dose, then 1 tab PO QD x 4 days 6 tablet Scot Jun, FNP      PDMP not reviewed this encounter.   Scot Jun, FNP 01/28/21 1756

## 2021-02-08 ENCOUNTER — Telehealth: Payer: Self-pay

## 2021-02-08 MED ORDER — OMEPRAZOLE 20 MG PO CPDR: 20.0000 mg | DELAYED_RELEASE_CAPSULE | Freq: Two times a day (BID) | ORAL | 0 refills | Status: DC

## 2021-02-08 NOTE — Addendum Note (Signed)
Addended by: Ulyess Blossom L on: 02/08/2021 10:14 AM   Modules accepted: Orders

## 2021-02-08 NOTE — Telephone Encounter (Signed)
Agree with the prilosec 20mg  BID  RV

## 2021-02-08 NOTE — Telephone Encounter (Signed)
Patient states at night she is having gerd symptoms. She states that she take Omeprazole 40mg  in the morning. She is wanting to know if she can change to omeprazole 20mg  to take twice a day.

## 2021-02-08 NOTE — Telephone Encounter (Signed)
Sent medication to the pharmacy. And informed patient by detail message

## 2021-02-13 ENCOUNTER — Ambulatory Visit (INDEPENDENT_AMBULATORY_CARE_PROVIDER_SITE_OTHER): Payer: BC Managed Care – PPO

## 2021-02-13 ENCOUNTER — Ambulatory Visit: Payer: BC Managed Care – PPO

## 2021-02-13 ENCOUNTER — Other Ambulatory Visit: Payer: Self-pay

## 2021-02-13 ENCOUNTER — Ambulatory Visit
Admission: RE | Admit: 2021-02-13 | Discharge: 2021-02-13 | Disposition: A | Discharge status: Home / Self Care | Payer: BC Managed Care – PPO | Source: Ambulatory Visit | Attending: Family Medicine | Admitting: Family Medicine

## 2021-02-13 VITALS — BP 115/72 | HR 58 | Temp 98.8°F | Resp 18

## 2021-02-13 DIAGNOSIS — J22 Unspecified acute lower respiratory infection: Secondary | ICD-10-CM | POA: Diagnosis not present

## 2021-02-13 DIAGNOSIS — R0602 Shortness of breath: Secondary | ICD-10-CM | POA: Diagnosis not present

## 2021-02-13 MED ORDER — PREDNISONE 20 MG PO TABS: 40.0000 mg | ORAL_TABLET | Freq: Every day | ORAL | 0 refills | Status: DC

## 2021-02-13 MED ORDER — DOXYCYCLINE HYCLATE 100 MG PO CAPS: 100.0000 mg | ORAL_CAPSULE | Freq: Two times a day (BID) | ORAL | 0 refills | Status: DC

## 2021-02-13 NOTE — ED Triage Notes (Signed)
Patient presents to Urgent Care with complaints of coughing and wheezing since 12/23. Treating her symptoms with her inhaler with no improvement.

## 2021-02-13 NOTE — ED Provider Notes (Signed)
Grace [name]    CSN: [id number] Arrival date & time: 02/13/21  1152      History   Chief Complaint Chief Complaint  Patient presents with   Cough   Wheezing   Shortness of Breath    HPI Grace [name] is a 56 y.o. female.   HPI Patient presents today with cough, wheezing, and shortness of breath following recent treatment on 01/28/21 for acute sinusitis and acute bronchitis. She reports over the last three days developing worsening shortness of breath with  increased use of her rescue and chronic inhaler without sustained relief.  Coughing is dry, exacerbated by talking and occurs without a known trigger. She completed previously prescribed medication initially felt improvement of symptoms however, cough never completely resolved and she subsequently developed wheezing and shortness of breath.  She denies fever or any symptoms concerning for secondary viral illness.  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   Qualifier: Diagnosis of  By: Marcelino Scot CMA, [place] Bilberry     Allergic rhinitis    Biliary colic    Bronchitis    Cardiomyopathy, peripartum, postpartum condition    THIS RESOLVED 6 MONTHS AFTER DELIVERY   Diabetes mellitus without complication (HCC)    GERD (gastroesophageal reflux disease) 05/07/2012   Heart valve regurgitation    Hx of dysplastic nevus 2019   multiple sites   HYPOGLYCEMIA 11/08/2007   Qualifier: Diagnosis of  By: Marcelino Scot CMA, [place] Bilberry     Other chest pain 09/14/2009   Qualifier: Diagnosis of  By: Rockey Situ MD, Tim      Patient Active Problem List   Diagnosis Date Noted   Allergic rhinitis due to animal (cat) (dog) hair and dander 10/19/2020   Allergic rhinitis due to pollen 10/19/2020   Chronic allergic conjunctivitis 10/19/2020   Mild intermittent asthma 10/19/2020   Moderate persistent asthma with (acute) exacerbation 10/19/2020   Asthma 09/29/2020   Abdominal pain, chronic, epigastric    Allergic reaction  to food 12/09/2013   Chronic idiopathic urticaria 12/09/2013   Rash and nonspecific skin eruption 12/09/2013   GERD (gastroesophageal reflux disease) 05/07/2012   HYPERLIPIDEMIA-MIXED 09/14/2009   DYSPNEA 09/14/2009   OTHER CHEST PAIN 09/14/2009   ONYCHIA AND PARONYCHIA OF FINGER 11/11/2007   HYPOGLYCEMIA 11/08/2007   HEMORRHOIDS 11/08/2007   ALLERGIC RHINITIS 11/08/2007   ACNE VULGARIS 11/08/2007    Past Surgical History:  Procedure Laterality Date   caesarean section     CHOLECYSTECTOMY N/A 05/21/2017   Procedure: LAPAROSCOPIC CHOLECYSTECTOMY;  Surgeon: Florene Glen, MD;  Location: [facility] ORS;  Service: General;  Laterality: N/A;   COLONOSCOPY WITH PROPOFOL N/A 05/16/2017   Procedure: COLONOSCOPY WITH PROPOFOL;  Surgeon: Lin Landsman, MD;  Location: [facility] ENDOSCOPY;  Service: Gastroenterology;  Laterality: N/A;   ESOPHAGOGASTRODUODENOSCOPY (EGD) WITH PROPOFOL N/A 05/16/2017   Procedure: ESOPHAGOGASTRODUODENOSCOPY (EGD) WITH PROPOFOL;  Surgeon: Lin Landsman, MD;  Location: [facility] ENDOSCOPY;  Service: Gastroenterology;  Laterality: N/A;   htn     pulmonary edema with childbith, had cardiac w/ up   SPIROMETRY  12/07   at allergist's- normal   WISDOM TOOTH EXTRACTION      OB History   No obstetric history on file.      Home Medications    Prior to Admission medications   Medication Sig Start Date End Date Taking? Authorizing Provider  doxycycline (VIBRAMYCIN) 100 MG capsule Take 1 capsule (100 mg total) by mouth 2 (two) times daily. 02/13/21  Yes Yanely Mast,  Carroll Sage, FNP  predniSONE (DELTASONE) 20 MG tablet Take 2 tablets (40 mg total) by mouth daily with breakfast. 02/13/21  Yes Scot Jun, FNP  albuterol (PROVENTIL HFA;VENTOLIN HFA) 108 (90 Base) MCG/ACT inhaler Inhale 2 puffs into the lungs every 6 (six) hours as needed for wheezing or shortness of breath. 02/26/17   Duanne Guess, PA-C  ALPRAZolam Duanne Moron) 0.5 MG tablet alprazolam 0.5 mg tablet  TAKE 1  TABLET TWICE A DAY AS NEEDED    [provider]  azithromycin (ZITHROMAX) 250 MG tablet Take 2 tabs PO x 1 dose, then 1 tab PO QD x 4 days 01/28/21   Scot Jun, FNP  B Complex Vitamins (VITAMIN B COMPLEX) TABS See admin instructions.    [provider]  budesonide-formoterol (SYMBICORT) 80-4.5 MCG/ACT inhaler SMARTSIG:2 Puff(s) By Mouth Twice Daily 08/06/20   [provider]  EPINEPHrine 0.3 mg/0.3 mL IJ SOAJ injection Inject 0.3 mg into the muscle once as needed.  11/14/13   [provider]  Estradiol 10 MCG TABS vaginal tablet Imvexxy Maintenance Pack 10 mcg vaginal insert  INSERT 1 CAPSULE VAGINALLY TWICE WEEKLY    [provider]  furosemide (LASIX) 20 MG tablet TAKE 1 TABLET BY MOUTH EVERY DAY IN THE MORNING 04/16/17   [provider]  metFORMIN (GLUCOPHAGE) 500 MG tablet Take 500 mg by mouth 2 (two) times daily. 06/21/20   [provider]  mometasone (NASONEX) 50 MCG/ACT nasal spray Place into the nose.    [provider]  Multiple Vitamins-Minerals (MULTIVITAL) tablet Take 1 tablet by mouth daily.      [provider]  Olopatadine HCl 0.2 % SOLN 1 drop into affected eye    [provider]  omeprazole (PRILOSEC) 20 MG capsule Take 1 capsule (20 mg total) by mouth 2 (two) times daily before a meal. 02/08/21 05/09/21  Vanga, Tally Due, MD  sucralfate (CARAFATE) 1 g tablet Take 1 g by mouth 2 (two) times daily. 05/25/20   [provider]  Vitamin D, Ergocalciferol, (DRISDOL) 50000 UNITS CAPS Take 50,000 Units by mouth every 7 (seven) days.    [provider]    Family History Family History  Problem Relation Age of Onset   Hyperlipidemia Mother    Hypertension Mother    Hypertension Father    Hyperlipidemia Father    Breast cancer Maternal Aunt 51   Breast cancer Maternal Grandmother 51    Social History Social History   Tobacco Use   Smoking status: Never   Smokeless  tobacco: Never  Vaping Use   Vaping Use: Never used  Substance Use Topics   Alcohol use: Not Currently    Comment: occas   Drug use: No     Allergies   Antihistamines, diphenhydramine-type; Aspirin; Cephalexin; Cetirizine; Dramamine [dimenhydrinate]; Eggs or egg-derived products; Gluten meal; Meclizine; Penicillins; and Tilactase   Review of Systems Review of Systems Pertinent negatives listed in HPI   Physical Exam Triage Vital Signs ED Triage Vitals  Enc Vitals Group     BP 02/13/21 1158 115/72     Pulse Rate 02/13/21 1158 (!) 58     Resp 02/13/21 1158 18     Temp 02/13/21 1158 98.8 F (37.1 C)     Temp Source 02/13/21 1158 Oral     SpO2 02/13/21 1158 99 %     Weight --      Height --      Head Circumference --  Peak Flow --      Pain Score 02/13/21 1157 0     Pain Loc --      Pain Edu? --      Excl. in [place]? --    No data found.  Updated Vital Signs BP 115/72 (BP Location: Left Arm)    Pulse (!) 58    Temp 98.8 F (37.1 C) (Oral)    Resp 18    LMP 05/18/2015 (Approximate)    SpO2 99%   Visual Acuity Right Eye Distance:   Left Eye Distance:   Bilateral Distance:    Right Eye Near:   Left Eye Near:    Bilateral Near:     Physical Exam Constitutional:      Appearance: She is ill-appearing.  HENT:     Head: Normocephalic.  Eyes:     Extraocular Movements: Extraocular movements intact.     Pupils: Pupils are equal, round, and reactive to light.  Cardiovascular:     Rate and Rhythm: Normal rate and regular rhythm.  Pulmonary:     Breath sounds: Wheezing and rhonchi present.     Comments: Persistent dry cough  Musculoskeletal:        General: Normal range of motion.     Cervical back: Normal range of motion and neck supple.  Skin:    General: Skin is warm and dry.     Capillary Refill: Capillary refill takes less than 2 seconds.  Neurological:     General: No focal deficit present.     Mental Status: She is alert.  Psychiatric:        Mood  and Affect: Mood normal.        Behavior: Behavior normal.     UC Treatments / Results  Labs (all labs ordered are listed, but only abnormal results are displayed) Labs Reviewed - No data to display  EKG   Radiology No results found.  Procedures Procedures (including critical care time)  Medications Ordered in UC Medications - No data to display  Initial Impression / Assessment and Plan / UC Course  I have reviewed the triage vital signs and the nursing notes.  Pertinent labs & imaging results that were available during my care of the patient were reviewed by me and considered in my medical decision making (see chart for details).    Lower respiratory infection,  CXR negative for pneumonia and negative for cardiomegaly. Treatment with Doxycycline BID x 10 days and prednisone 40 mg daily with breakfast.  Strict ER precautions if your symptoms worsen or do not readily improve. Follow-up here PRN Final Clinical Impressions(s) / UC Diagnoses   Final diagnoses:  Lower respiratory infection     Discharge Instructions      Your chest x-ray is negative for pneumonia. Treating you for lower respiratory infection. Schedule a follow-up with your allergist within the next few weeks.     ED Prescriptions     Medication Sig Dispense Auth. Provider   doxycycline (VIBRAMYCIN) 100 MG capsule Take 1 capsule (100 mg total) by mouth 2 (two) times daily. 20 capsule Scot Jun, FNP   predniSONE (DELTASONE) 20 MG tablet Take 2 tablets (40 mg total) by mouth daily with breakfast. 10 tablet Scot Jun, FNP      PDMP not reviewed this encounter.   Scot Jun, [place] 02/18/21 [phone]

## 2021-02-13 NOTE — Discharge Instructions (Signed)
Your chest x-ray is negative for pneumonia. Treating you for lower respiratory infection. Schedule a follow-up with your allergist within the next few weeks.

## 2021-03-25 ENCOUNTER — Other Ambulatory Visit: Payer: Self-pay | Admitting: Gastroenterology

## 2021-04-12 ENCOUNTER — Other Ambulatory Visit: Payer: Self-pay | Admitting: Gastroenterology

## 2021-05-06 DIAGNOSIS — J4541 Moderate persistent asthma with (acute) exacerbation: Secondary | ICD-10-CM | POA: Diagnosis not present

## 2021-05-06 DIAGNOSIS — H1045 Other chronic allergic conjunctivitis: Secondary | ICD-10-CM | POA: Diagnosis not present

## 2021-05-06 DIAGNOSIS — J3089 Other allergic rhinitis: Secondary | ICD-10-CM | POA: Diagnosis not present

## 2021-05-06 DIAGNOSIS — J4520 Mild intermittent asthma, uncomplicated: Secondary | ICD-10-CM | POA: Diagnosis not present

## 2021-05-06 DIAGNOSIS — J3081 Allergic rhinitis due to animal (cat) (dog) hair and dander: Secondary | ICD-10-CM | POA: Diagnosis not present

## 2021-05-06 DIAGNOSIS — J301 Allergic rhinitis due to pollen: Secondary | ICD-10-CM | POA: Diagnosis not present

## 2021-05-28 ENCOUNTER — Other Ambulatory Visit: Payer: Self-pay | Admitting: Gastroenterology

## 2021-06-15 ENCOUNTER — Other Ambulatory Visit: Payer: Self-pay | Admitting: Gastroenterology

## 2021-06-29 ENCOUNTER — Other Ambulatory Visit: Payer: Self-pay | Admitting: Gastroenterology

## 2021-07-11 DIAGNOSIS — R7301 Impaired fasting glucose: Secondary | ICD-10-CM | POA: Diagnosis not present

## 2021-07-11 DIAGNOSIS — E559 Vitamin D deficiency, unspecified: Secondary | ICD-10-CM | POA: Diagnosis not present

## 2021-07-11 DIAGNOSIS — E785 Hyperlipidemia, unspecified: Secondary | ICD-10-CM | POA: Diagnosis not present

## 2021-07-11 DIAGNOSIS — G5600 Carpal tunnel syndrome, unspecified upper limb: Secondary | ICD-10-CM | POA: Diagnosis not present

## 2021-07-11 DIAGNOSIS — R600 Localized edema: Secondary | ICD-10-CM | POA: Diagnosis not present

## 2021-07-13 ENCOUNTER — Other Ambulatory Visit: Payer: Self-pay | Admitting: Gastroenterology

## 2021-07-25 DIAGNOSIS — J4520 Mild intermittent asthma, uncomplicated: Secondary | ICD-10-CM | POA: Diagnosis not present

## 2021-07-25 DIAGNOSIS — Z6841 Body Mass Index (BMI) 40.0 and over, adult: Secondary | ICD-10-CM | POA: Diagnosis not present

## 2021-07-25 DIAGNOSIS — E785 Hyperlipidemia, unspecified: Secondary | ICD-10-CM | POA: Diagnosis not present

## 2021-07-25 DIAGNOSIS — E669 Obesity, unspecified: Secondary | ICD-10-CM | POA: Diagnosis not present

## 2021-07-25 DIAGNOSIS — K219 Gastro-esophageal reflux disease without esophagitis: Secondary | ICD-10-CM | POA: Diagnosis not present

## 2021-07-25 DIAGNOSIS — F32A Depression, unspecified: Secondary | ICD-10-CM | POA: Diagnosis not present

## 2021-08-28 ENCOUNTER — Other Ambulatory Visit: Payer: Self-pay | Admitting: Gastroenterology

## 2021-09-08 ENCOUNTER — Other Ambulatory Visit: Payer: Self-pay

## 2021-09-08 MED ORDER — OMEPRAZOLE 20 MG PO CPDR: 20.0000 mg | DELAYED_RELEASE_CAPSULE | Freq: Two times a day (BID) | ORAL | 4 refills | Status: DC

## 2021-10-07 DIAGNOSIS — Z1211 Encounter for screening for malignant neoplasm of colon: Secondary | ICD-10-CM | POA: Diagnosis not present

## 2021-10-07 DIAGNOSIS — E785 Hyperlipidemia, unspecified: Secondary | ICD-10-CM | POA: Diagnosis not present

## 2021-10-07 DIAGNOSIS — G4489 Other headache syndrome: Secondary | ICD-10-CM | POA: Diagnosis not present

## 2021-10-07 DIAGNOSIS — J4520 Mild intermittent asthma, uncomplicated: Secondary | ICD-10-CM | POA: Diagnosis not present

## 2021-10-07 DIAGNOSIS — M8580 Other specified disorders of bone density and structure, unspecified site: Secondary | ICD-10-CM | POA: Diagnosis not present

## 2021-12-22 ENCOUNTER — Other Ambulatory Visit: Payer: Self-pay

## 2021-12-26 ENCOUNTER — Ambulatory Visit: Payer: BC Managed Care – PPO | Admitting: Gastroenterology

## 2022-02-14 ENCOUNTER — Ambulatory Visit: Payer: BC Managed Care – PPO | Admitting: Gastroenterology

## 2022-03-05 ENCOUNTER — Ambulatory Visit: Payer: Self-pay

## 2022-03-05 ENCOUNTER — Telehealth: Payer: BC Managed Care – PPO | Admitting: Nurse Practitioner

## 2022-03-05 DIAGNOSIS — J069 Acute upper respiratory infection, unspecified: Secondary | ICD-10-CM | POA: Diagnosis not present

## 2022-03-05 MED ORDER — PREDNISONE 20 MG PO TABS: 40.0000 mg | ORAL_TABLET | Freq: Every day | ORAL | 0 refills | Status: DC

## 2022-03-05 MED ORDER — AZITHROMYCIN 250 MG PO TABS: ORAL_TABLET | ORAL | 0 refills | Status: DC

## 2022-03-05 NOTE — Progress Notes (Signed)
Virtual Visit Consent   Grace [name], you are scheduled for a virtual visit with a [facility] provider today. Just as with appointments in the office, your consent must be obtained to participate. Your consent will be active for this visit and any virtual visit you may have with one of our providers in the next 365 days. If you have a MyChart account, a copy of this consent can be sent to you electronically.  As this is a virtual visit, video technology does not allow for your provider to perform a traditional examination. This may limit your provider's ability to fully assess your condition. If your provider identifies any concerns that need to be evaluated in person or the need to arrange testing (such as labs, EKG, etc.), we will make arrangements to do so. Although advances in technology are sophisticated, we cannot ensure that it will always work on either your end or our end. If the connection with a video visit is poor, the visit may have to be switched to a telephone visit. With either a video or telephone visit, we are not always able to ensure that we have a secure connection.  By engaging in this virtual visit, you consent to the provision of healthcare and authorize for your insurance to be billed (if applicable) for the services provided during this visit. Depending on your insurance coverage, you may receive a charge related to this service.  I need to obtain your verbal consent now. Are you willing to proceed with your visit today? Grace [name] has provided verbal consent on 03/05/2022 for a virtual visit (video or telephone). Gildardo Pounds, NP  Date: 03/05/2022 9:49 AM  Virtual Visit via Video Note   I, Gildardo Pounds, connected with  Grace [name]  ([id number], 04-22-1965) on 03/05/22 at  9:30 AM EST by a video-enabled telemedicine application and verified that I am speaking with the correct person using two identifiers.  Location: Patient: Virtual Visit Location  Patient: Home Provider: Virtual Visit Location Provider: Home Office   I discussed the limitations of evaluation and management by telemedicine and the availability of in person appointments. The patient expressed understanding and agreed to proceed.    History of Present Illness: Grace [name] is a 57 y.o. who identifies as a female who was assigned female at birth, and is being seen today for URI symptoms   COVID tests negative.  Reports several days onset of cough and head/chest congestion, bilateral ear pressure with popping of ears, vocal hoarseness, fever and overall not feeling well.  She does have a history of asthma and has been using all of her allergy medications as prescribed as well as conservative therapy with over-the-counter medications.  Problems:  Patient Active Problem List   Diagnosis Date Noted   Allergic rhinitis due to animal (cat) (dog) hair and dander 10/19/2020   Allergic rhinitis due to pollen 10/19/2020   Chronic allergic conjunctivitis 10/19/2020   Mild intermittent asthma 10/19/2020   Moderate persistent asthma with (acute) exacerbation 10/19/2020   Asthma 09/29/2020   Abdominal pain, chronic, epigastric    Allergic reaction to food 12/09/2013   Chronic idiopathic urticaria 12/09/2013   Rash and nonspecific skin eruption 12/09/2013   GERD (gastroesophageal reflux disease) 05/07/2012   HYPERLIPIDEMIA-MIXED 09/14/2009   DYSPNEA 09/14/2009   OTHER CHEST PAIN 09/14/2009   ONYCHIA AND PARONYCHIA OF FINGER 11/11/2007   HYPOGLYCEMIA 11/08/2007   HEMORRHOIDS 11/08/2007   ALLERGIC RHINITIS 11/08/2007   ACNE VULGARIS 11/08/2007  Allergies:  Allergies  Allergen Reactions   Antihistamines, Diphenhydramine-Type    Aspirin Other (See Comments)   Cephalexin     REACTION: (hives)   Cetirizine Hives and Other (See Comments)   Dramamine [Dimenhydrinate] Other (See Comments)   Eggs Or Egg-Derived Products Itching    Mouth itching   Gluten Meal Swelling     Swelling of tongue, hives   Meclizine Other (See Comments)   Penicillins     REACTION: (child)   Tilactase Itching    Mouth itch   Medications:  Current Outpatient Medications:    albuterol (PROVENTIL HFA;VENTOLIN HFA) 108 (90 Base) MCG/ACT inhaler, Inhale 2 puffs into the lungs every 6 (six) hours as needed for wheezing or shortness of breath., Disp: 1 Inhaler, Rfl: 2   ALPRAZolam (XANAX) 0.5 MG tablet, alprazolam 0.5 mg tablet  TAKE 1 TABLET TWICE A DAY AS NEEDED, Disp: , Rfl:    atenolol (TENORMIN) 25 MG tablet, Take 25 mg by mouth daily., Disp: , Rfl:    Azelastine-Fluticasone 137-50 MCG/ACT SUSP, SPRAY 1 SPRAY INTO EACH NOSTRIL TWICE A DAY FOR 30 DAYS, Disp: , Rfl:    azithromycin (ZITHROMAX) 250 MG tablet, Take 2 tabs PO x 1 dose, then 1 tab PO QD x 4 days, Disp: 6 tablet, Rfl: 0   B Complex Vitamins (VITAMIN B COMPLEX) TABS, See admin instructions., Disp: , Rfl:    betamethasone valerate (VALISONE) 0.1 % cream, Apply 1 Application topically daily., Disp: , Rfl:    budesonide-formoterol (SYMBICORT) 80-4.5 MCG/ACT inhaler, SMARTSIG:2 Puff(s) By Mouth Twice Daily, Disp: , Rfl:    buPROPion (WELLBUTRIN) 75 MG tablet, Take 75 mg by mouth daily., Disp: , Rfl:    doxycycline (VIBRAMYCIN) 100 MG capsule, Take 1 capsule (100 mg total) by mouth 2 (two) times daily., Disp: 20 capsule, Rfl: 0   EPINEPHrine 0.3 mg/0.3 mL IJ SOAJ injection, Inject 0.3 mg into the muscle once as needed. , Disp: , Rfl:    Estradiol 10 MCG TABS vaginal tablet, Imvexxy Maintenance Pack 10 mcg vaginal insert  INSERT 1 CAPSULE VAGINALLY TWICE WEEKLY, Disp: , Rfl:    furosemide (LASIX) 20 MG tablet, TAKE 1 TABLET BY MOUTH EVERY DAY IN THE MORNING, Disp: , Rfl: 5   metFORMIN (GLUCOPHAGE) 500 MG tablet, Take 500 mg by mouth 2 (two) times daily., Disp: , Rfl:    mometasone (NASONEX) 50 MCG/ACT nasal spray, Place into the nose., Disp: , Rfl:    montelukast (SINGULAIR) 10 MG tablet, Take 10 mg by mouth at bedtime., Disp: , Rfl:     Multiple Vitamins-Minerals (MULTIVITAL) tablet, Take 1 tablet by mouth daily.  , Disp: , Rfl:    Olopatadine HCl 0.2 % SOLN, 1 drop into affected eye, Disp: , Rfl:    omeprazole (PRILOSEC) 20 MG capsule, Take 1 capsule (20 mg total) by mouth 2 (two) times daily before a meal., Disp: 60 capsule, Rfl: 4   predniSONE (DELTASONE) 20 MG tablet, Take 2 tablets (40 mg total) by mouth daily with breakfast., Disp: 10 tablet, Rfl: 0   Semaglutide,0.25 or 0.5MG/DOS, (OZEMPIC, 0.25 OR 0.5 MG/DOSE,) 2 MG/1.5ML SOPN, Inject 0.25 mg as directed once a week., Disp: , Rfl:    sucralfate (CARAFATE) 1 g tablet, Take 1 g by mouth 2 (two) times daily., Disp: , Rfl:    Vitamin D, Ergocalciferol, (DRISDOL) 50000 UNITS CAPS, Take 50,000 Units by mouth every 7 (seven) days., Disp: , Rfl:   Observations/Objective: Patient is well-developed, well-nourished in no acute distress.  Resting comfortably  at home.  Head is normocephalic, atraumatic.  No labored breathing.  Speech is clear and coherent with logical content.  Patient is alert and oriented at baseline.    Assessment and Plan: 1. URI with cough and congestion - azithromycin (ZITHROMAX) 250 MG tablet; Take 2 tabs PO x 1 dose, then 1 tab PO QD x 4 days  Dispense: 6 tablet; Refill: 0 - predniSONE (DELTASONE) 20 MG tablet; Take 2 tablets (40 mg total) by mouth daily with breakfast.  Dispense: 10 tablet; Refill: 0  INSTRUCTIONS: use a humidifier for nasal congestion Drink plenty of fluids, rest and wash hands frequently to avoid the spread of infection Alternate tylenol and Motrin for relief of fever   Follow Up Instructions: I discussed the assessment and treatment plan with the patient. The patient was provided an opportunity to ask questions and all were answered. The patient agreed with the plan and demonstrated an understanding of the instructions.  A copy of instructions were sent to the patient via MyChart unless otherwise noted below.    The patient  was advised to call back or seek an in-person evaluation if the symptoms worsen or if the condition fails to improve as anticipated.  Time:  I spent 11 minutes with the patient via telehealth technology discussing the above problems/concerns.    Gildardo Pounds, NP

## 2022-03-05 NOTE — Patient Instructions (Signed)
Cheryl Flash, thank you for joining Gildardo Pounds, NP for today's virtual visit.  While this provider is not your primary care provider (PCP), if your PCP is located in our provider database this encounter information will be shared with them immediately following your visit.   [facility] account gives you access to today's visit and all your visits, tests, and labs performed at [facility] " click here if you don't have a [facility] account or go to [email or web address]  Consent: (Patient) Grace [name] Orders [place] provided verbal consent for this virtual visit at the beginning of the encounter.  Current Medications:  Current Outpatient Medications:    albuterol (PROVENTIL HFA;VENTOLIN HFA) 108 (90 Base) MCG/ACT inhaler, Inhale 2 puffs into the lungs every 6 (six) hours as needed for wheezing or shortness of breath., Disp: 1 Inhaler, Rfl: 2   ALPRAZolam (XANAX) 0.5 MG tablet, alprazolam 0.5 mg tablet  TAKE 1 TABLET TWICE A DAY AS NEEDED, Disp: , Rfl:    atenolol (TENORMIN) 25 MG tablet, Take 25 mg by mouth daily., Disp: , Rfl:    Azelastine-Fluticasone 137-50 MCG/ACT SUSP, SPRAY 1 SPRAY INTO EACH NOSTRIL TWICE A DAY FOR 30 DAYS, Disp: , Rfl:    azithromycin (ZITHROMAX) 250 MG tablet, Take 2 tabs PO x 1 dose, then 1 tab PO QD x 4 days, Disp: 6 tablet, Rfl: 0   B Complex Vitamins (VITAMIN B COMPLEX) TABS, See admin instructions., Disp: , Rfl:    betamethasone valerate (VALISONE) 0.1 % cream, Apply 1 Application topically daily., Disp: , Rfl:    budesonide-formoterol (SYMBICORT) 80-4.5 MCG/ACT inhaler, SMARTSIG:2 Puff(s) By Mouth Twice Daily, Disp: , Rfl:    buPROPion (WELLBUTRIN) 75 MG tablet, Take 75 mg by mouth daily., Disp: , Rfl:    doxycycline (VIBRAMYCIN) 100 MG capsule, Take 1 capsule (100 mg total) by mouth 2 (two) times daily., Disp: 20 capsule, Rfl: 0   EPINEPHrine 0.3 mg/0.3 mL IJ SOAJ injection, Inject 0.3 mg into the muscle once as needed. , Disp: ,  Rfl:    Estradiol 10 MCG TABS vaginal tablet, Imvexxy Maintenance Pack 10 mcg vaginal insert  INSERT 1 CAPSULE VAGINALLY TWICE WEEKLY, Disp: , Rfl:    furosemide (LASIX) 20 MG tablet, TAKE 1 TABLET BY MOUTH EVERY DAY IN THE MORNING, Disp: , Rfl: 5   metFORMIN (GLUCOPHAGE) 500 MG tablet, Take 500 mg by mouth 2 (two) times daily., Disp: , Rfl:    mometasone (NASONEX) 50 MCG/ACT nasal spray, Place into the nose., Disp: , Rfl:    montelukast (SINGULAIR) 10 MG tablet, Take 10 mg by mouth at bedtime., Disp: , Rfl:    Multiple Vitamins-Minerals (MULTIVITAL) tablet, Take 1 tablet by mouth daily.  , Disp: , Rfl:    Olopatadine HCl 0.2 % SOLN, 1 drop into affected eye, Disp: , Rfl:    omeprazole (PRILOSEC) 20 MG capsule, Take 1 capsule (20 mg total) by mouth 2 (two) times daily before a meal., Disp: 60 capsule, Rfl: 4   predniSONE (DELTASONE) 20 MG tablet, Take 2 tablets (40 mg total) by mouth daily with breakfast., Disp: 10 tablet, Rfl: 0   Semaglutide,0.25 or 0.5MG/DOS, (OZEMPIC, 0.25 OR 0.5 MG/DOSE,) 2 MG/1.5ML SOPN, Inject 0.25 mg as directed once a week., Disp: , Rfl:    sucralfate (CARAFATE) 1 g tablet, Take 1 g by mouth 2 (two) times daily., Disp: , Rfl:    Vitamin D, Ergocalciferol, (DRISDOL) 50000 UNITS CAPS, Take 50,000 Units by mouth every 7 (seven)  days., Disp: , Rfl:    Medications ordered in this encounter:  Meds ordered this encounter  Medications   azithromycin (ZITHROMAX) 250 MG tablet    Sig: Take 2 tabs PO x 1 dose, then 1 tab PO QD x 4 days    Dispense:  6 tablet    Refill:  0    Order Specific Question:   Supervising Provider    Answer:   Chase Picket [id number]   predniSONE (DELTASONE) 20 MG tablet    Sig: Take 2 tablets (40 mg total) by mouth daily with breakfast.    Dispense:  10 tablet    Refill:  0    Order Specific Question:   Supervising Provider    Answer:   Chase Picket [id number]     *If you need refills on other medications prior to your next appointment,  please contact your pharmacy*  Follow-Up: Call back or seek an in-person evaluation if the symptoms worsen or if the condition fails to improve as anticipated.  [place] [phone]  Other Instructions INSTRUCTIONS: use a humidifier for nasal congestion Drink plenty of fluids, rest and wash hands frequently to avoid the spread of infection Alternate tylenol and Motrin for relief of fever    If you have been instructed to have an in-person evaluation today at a local Urgent Care facility, please use the link below. It will take you to a list of all of our available [facility], including address, phone number and hours of operation. Please do not delay care.  [facility]  If you or a family member do not have a primary care provider, use the link below to schedule a visit and establish care. When you choose a [facility] primary care physician or advanced practice provider, you gain a long-term partner in health. Find a Primary Care Provider  Learn more about [facility]'s in-office and virtual care options: [facility]

## 2022-03-31 DIAGNOSIS — R600 Localized edema: Secondary | ICD-10-CM | POA: Diagnosis not present

## 2022-03-31 DIAGNOSIS — E785 Hyperlipidemia, unspecified: Secondary | ICD-10-CM | POA: Diagnosis not present

## 2022-03-31 DIAGNOSIS — E559 Vitamin D deficiency, unspecified: Secondary | ICD-10-CM | POA: Diagnosis not present

## 2022-03-31 DIAGNOSIS — K219 Gastro-esophageal reflux disease without esophagitis: Secondary | ICD-10-CM | POA: Diagnosis not present

## 2022-04-07 ENCOUNTER — Other Ambulatory Visit: Payer: Self-pay | Admitting: Endocrinology

## 2022-04-07 DIAGNOSIS — E559 Vitamin D deficiency, unspecified: Secondary | ICD-10-CM | POA: Diagnosis not present

## 2022-04-07 DIAGNOSIS — E669 Obesity, unspecified: Secondary | ICD-10-CM | POA: Diagnosis not present

## 2022-04-07 DIAGNOSIS — E785 Hyperlipidemia, unspecified: Secondary | ICD-10-CM | POA: Diagnosis not present

## 2022-04-07 DIAGNOSIS — Z6841 Body Mass Index (BMI) 40.0 and over, adult: Secondary | ICD-10-CM | POA: Diagnosis not present

## 2022-04-07 DIAGNOSIS — Z1231 Encounter for screening mammogram for malignant neoplasm of breast: Secondary | ICD-10-CM

## 2022-04-07 DIAGNOSIS — R7301 Impaired fasting glucose: Secondary | ICD-10-CM | POA: Diagnosis not present

## 2022-04-11 ENCOUNTER — Ambulatory Visit: Payer: Self-pay | Admitting: Internal Medicine

## 2022-05-09 ENCOUNTER — Ambulatory Visit: Payer: Self-pay | Admitting: Internal Medicine

## 2022-05-18 DIAGNOSIS — E6609 Other obesity due to excess calories: Secondary | ICD-10-CM | POA: Diagnosis not present

## 2022-05-18 DIAGNOSIS — G629 Polyneuropathy, unspecified: Secondary | ICD-10-CM | POA: Diagnosis not present

## 2022-05-18 DIAGNOSIS — E559 Vitamin D deficiency, unspecified: Secondary | ICD-10-CM | POA: Diagnosis not present

## 2022-05-22 ENCOUNTER — Ambulatory Visit
Admission: RE | Admit: 2022-05-22 | Discharge: 2022-05-22 | Disposition: A | Discharge status: Home / Self Care | Payer: BC Managed Care – PPO | Source: Ambulatory Visit | Attending: Endocrinology | Admitting: Endocrinology

## 2022-05-22 DIAGNOSIS — Z1231 Encounter for screening mammogram for malignant neoplasm of breast: Secondary | ICD-10-CM

## 2022-06-05 ENCOUNTER — Other Ambulatory Visit: Payer: Self-pay | Admitting: Obstetrics and Gynecology

## 2022-06-05 DIAGNOSIS — R921 Mammographic calcification found on diagnostic imaging of breast: Secondary | ICD-10-CM

## 2022-06-06 ENCOUNTER — Telehealth: Payer: Self-pay | Admitting: Internal Medicine

## 2022-06-06 ENCOUNTER — Ambulatory Visit: Payer: Self-pay | Admitting: Internal Medicine

## 2022-06-06 NOTE — Telephone Encounter (Signed)
Notified [name] w/ Dr. Gregery Na office that patient has been scheduled 3 times as new patient and she has cancelled each time-Grace [name]

## 2022-06-09 ENCOUNTER — Ambulatory Visit
Admission: RE | Admit: 2022-06-09 | Discharge: 2022-06-09 | Disposition: A | Discharge status: Home / Self Care | Payer: BC Managed Care – PPO | Source: Ambulatory Visit | Attending: Obstetrics and Gynecology | Admitting: Obstetrics and Gynecology

## 2022-06-09 DIAGNOSIS — R921 Mammographic calcification found on diagnostic imaging of breast: Secondary | ICD-10-CM | POA: Diagnosis not present

## 2022-07-12 DIAGNOSIS — E669 Obesity, unspecified: Secondary | ICD-10-CM | POA: Diagnosis not present

## 2022-07-12 DIAGNOSIS — M65871 Other synovitis and tenosynovitis, right ankle and foot: Secondary | ICD-10-CM | POA: Diagnosis not present

## 2022-08-04 DIAGNOSIS — M8580 Other specified disorders of bone density and structure, unspecified site: Secondary | ICD-10-CM | POA: Diagnosis not present

## 2022-08-04 DIAGNOSIS — E559 Vitamin D deficiency, unspecified: Secondary | ICD-10-CM | POA: Diagnosis not present

## 2022-08-04 DIAGNOSIS — E789 Disorder of lipoprotein metabolism, unspecified: Secondary | ICD-10-CM | POA: Diagnosis not present

## 2022-08-04 DIAGNOSIS — K219 Gastro-esophageal reflux disease without esophagitis: Secondary | ICD-10-CM | POA: Diagnosis not present

## 2022-08-09 DIAGNOSIS — R7301 Impaired fasting glucose: Secondary | ICD-10-CM | POA: Diagnosis not present

## 2022-08-09 DIAGNOSIS — E559 Vitamin D deficiency, unspecified: Secondary | ICD-10-CM | POA: Diagnosis not present

## 2022-08-09 DIAGNOSIS — R600 Localized edema: Secondary | ICD-10-CM | POA: Diagnosis not present

## 2022-08-09 DIAGNOSIS — E669 Obesity, unspecified: Secondary | ICD-10-CM | POA: Diagnosis not present

## 2022-08-09 DIAGNOSIS — E785 Hyperlipidemia, unspecified: Secondary | ICD-10-CM | POA: Diagnosis not present

## 2022-08-15 ENCOUNTER — Ambulatory Visit: Payer: BC Managed Care – PPO

## 2022-08-15 ENCOUNTER — Other Ambulatory Visit: Payer: Self-pay

## 2022-08-15 ENCOUNTER — Emergency Department
Admission: EM | Admit: 2022-08-15 | Discharge: 2022-08-15 | Disposition: A | Discharge status: Home / Self Care | Payer: BC Managed Care – PPO | Attending: Emergency Medicine | Admitting: Emergency Medicine

## 2022-08-15 ENCOUNTER — Emergency Department: Payer: BC Managed Care – PPO

## 2022-08-15 ENCOUNTER — Ambulatory Visit
Admission: RE | Admit: 2022-08-15 | Payer: BC Managed Care – PPO | Source: Ambulatory Visit | Attending: Endocrinology | Admitting: Endocrinology

## 2022-08-15 DIAGNOSIS — J45909 Unspecified asthma, uncomplicated: Secondary | ICD-10-CM | POA: Insufficient documentation

## 2022-08-15 DIAGNOSIS — E119 Type 2 diabetes mellitus without complications: Secondary | ICD-10-CM | POA: Diagnosis not present

## 2022-08-15 DIAGNOSIS — Z9049 Acquired absence of other specified parts of digestive tract: Secondary | ICD-10-CM | POA: Diagnosis not present

## 2022-08-15 DIAGNOSIS — R1011 Right upper quadrant pain: Secondary | ICD-10-CM | POA: Insufficient documentation

## 2022-08-15 LAB — COMPREHENSIVE METABOLIC PANEL
ALT: 23 U/L (ref 0–44)
AST: 22 U/L (ref 15–41)
Albumin: 3.9 g/dL (ref 3.5–5.0)
Alkaline Phosphatase: 91 U/L (ref 38–126)
Anion gap: 9 (ref 5–15)
BUN: 9 mg/dL (ref 6–20)
CO2: 24 mmol/L (ref 22–32)
Calcium: 8.4 mg/dL — ABNORMAL LOW (ref 8.9–10.3)
Chloride: 106 mmol/L (ref 98–111)
Creatinine, Ser: 1.1 mg/dL — ABNORMAL HIGH (ref 0.44–1.00)
GFR, Estimated: 59 mL/min — ABNORMAL LOW (ref >60)
Glucose, Bld: 102 mg/dL — ABNORMAL HIGH (ref 70–99)
Potassium: 3.7 mmol/L (ref 3.5–5.1)
Sodium: 139 mmol/L (ref 135–145)
Total Bilirubin: 0.5 mg/dL (ref 0.3–1.2)
Total Protein: 7 g/dL (ref 6.5–8.1)

## 2022-08-15 LAB — URINALYSIS, ROUTINE W REFLEX MICROSCOPIC
Bilirubin Urine: NEGATIVE
Glucose, UA: NEGATIVE mg/dL
Hgb urine dipstick: NEGATIVE
Ketones, ur: NEGATIVE mg/dL
Nitrite: NEGATIVE
Protein, ur: NEGATIVE mg/dL
Specific Gravity, Urine: 1.002 — ABNORMAL LOW (ref 1.005–1.030)
pH: 6 (ref 5.0–8.0)

## 2022-08-15 LAB — CBC
HCT: 39.7 % (ref 36.0–46.0)
Hemoglobin: 12.8 g/dL (ref 12.0–15.0)
MCH: 28.1 pg (ref 26.0–34.0)
MCHC: 32.2 g/dL (ref 30.0–36.0)
MCV: 87.1 fL (ref 80.0–100.0)
Platelets: 255 10*3/uL (ref 150–400)
RBC: 4.56 MIL/uL (ref 3.87–5.11)
RDW: 14.9 % (ref 11.5–15.5)
WBC: 8.7 10*3/uL (ref 4.0–10.5)
nRBC: 0 % (ref 0.0–0.2)

## 2022-08-15 LAB — LIPASE, BLOOD: Lipase: 38 U/L (ref 11–51)

## 2022-08-15 NOTE — ED Provider Notes (Signed)
[facility] Emergency Department Provider Note     Event Date/Time   First MD Initiated Contact with Patient 08/15/22 1719     (approximate)   History   Abdominal Pain   HPI  Grace [name] is a 57 y.o. female with a history of GERD, diabetes, HLD, and asthma, presents to the ED with complaints of right upper quadrant abdominal pain.  Patient was evaluated by her PCP for her complaints, and advised to report to the ED for further evaluation and testing.  Patient would endorse onset of symptoms on Friday.  She describes the pain as an intermittent discomfort.  She reports normal bowel movements and denies any urinary symptoms.  Patient also denies any fevers, nausea, vomiting, chest pain, or shortness of breath.  He gives a surgical history that includes a cholecystectomy.    Physical Exam   Triage Vital Signs: ED Triage Vitals  Encounter Vitals Group     BP 08/15/22 1635 137/71     Systolic BP Percentile --      Diastolic BP Percentile --      Pulse Rate 08/15/22 1635 64     Resp --      Temp 08/15/22 1635 98.3 F (36.8 C)     Temp Source 08/15/22 1635 Oral     SpO2 08/15/22 1635 99 %     Weight 08/15/22 1636 210 lb (95.3 kg)     Height 08/15/22 1636 5\' 1"  (1.549 m)     Head Circumference --      Peak Flow --      Pain Score 08/15/22 1635 4     Pain Loc --      Pain Education --      Exclude from Growth Chart --     Most recent vital signs: Vitals:   08/15/22 1635 08/15/22 2011  BP: 137/71 (!) 141/79  Pulse: 64 68  Resp:  18  Temp: 98.3 F (36.8 C)   SpO2: 99% 99%    General Awake, no distress. NAD HEENT NCAT. PERRL. EOMI. Conjunctival clear and without icterus bilaterally. No rhinorrhea. Mucous membranes are moist.  CV:  Good peripheral perfusion. RRR RESP:  Normal effort. CTA ABD:  No distention. Soft,  mildly tender to palp over the epigastrium and RUQ. Normal bowel sounds. No rebound, guarding, or rigidity.    ED Results /  Procedures / Treatments   Labs (all labs ordered are listed, but only abnormal results are displayed) Labs Reviewed  COMPREHENSIVE METABOLIC PANEL - Abnormal; Notable for the following components:      Result Value   Glucose, Bld 102 (*)    Creatinine, Ser 1.10 (*)    Calcium 8.4 (*)    GFR, Estimated 59 (*)    All other components within normal limits  URINALYSIS, ROUTINE W REFLEX MICROSCOPIC - Abnormal; Notable for the following components:   Color, Urine STRAW (*)    APPearance HAZY (*)    Specific Gravity, Urine 1.002 (*)    Leukocytes,Ua LARGE (*)    Bacteria, UA RARE (*)    All other components within normal limits  LIPASE, BLOOD  CBC    EKG  Vent. rate 64 BPM PR interval 162 ms QRS duration 100 ms QT/QTcB 456/470 ms P-R-T axes 49 2 43 Normal sinus rhythm Incomplete right bundle branch block Cannot rule out Anterior infarct , age undetermined Abnormal ECG When compared with ECG of 11-Oct-2020 10:23, Nonspecific T wave abnormality, improved in Lateral  leads No STEMI  RADIOLOGY  I personally viewed and evaluated these images as part of my medical decision making, as well as reviewing the written report by the radiologist.  ED Provider Interpretation: no acute findings  No results found.   PROCEDURES:  Critical Care performed: No  Procedures   MEDICATIONS ORDERED IN ED: Medications - No data to display   IMPRESSION / MDM / ASSESSMENT AND PLAN / ED COURSE  I reviewed the triage vital signs and the nursing notes.                              Differential diagnosis includes, but is not limited to, biliary disease (biliary colic, acute cholecystitis, cholangitis, choledocholithiasis, etc), intrathoracic causes for epigastric abdominal pain including ACS, gastritis, duodenitis, pancreatitis, small bowel or large bowel obstruction, abdominal aortic aneurysm, hernia, and ulcer(s).  Patient's presentation is most consistent with acute complicated illness /  injury requiring diagnostic workup.  Patient's diagnosis is consistent with RUQ pain 5 year s/p lap cholecystectomy. Symptoms may represent sphincter of oddi spasm. She has a reassuring exam including labs and a WNL ultrasound. Patient is to follow up with Dr. Allegra Lai, as discussed, as needed or otherwise directed. Patient is given ED precautions to return to the ED for any worsening or new symptoms.   FINAL CLINICAL IMPRESSION(S) / ED DIAGNOSES   Final diagnoses:  Right upper quadrant abdominal pain     Rx / DC Orders   ED Discharge Orders     None        Note:  This document was prepared using Dragon voice recognition software and may include unintentional dictation errors.    Lissa Hoard, PA-C 08/16/22 1942    Pilar Jarvis, MD 08/22/22 2203

## 2022-08-15 NOTE — ED Notes (Signed)
See triage notes. Patient was seen at her PCP for abdominal pain today. They were unable to do the testing she needed.

## 2022-08-15 NOTE — Discharge Instructions (Signed)
Your exam, labs, and ultrasound overall reassuring at this time.  No signs of biliary tract obstruction at this time.  You should follow-up with your primary provider for ongoing concerns or return to the ED for worsening symptoms as discussed.

## 2022-08-15 NOTE — ED Notes (Signed)
EKG signed by Dr.Ray on A side.

## 2022-08-15 NOTE — ED Triage Notes (Signed)
Pt arrives to triage via POV w/ /co RUQ abd pain. Pt reports she has had her galbladder removed but does still have her appendix. Pt did see her PCP today and he told her to come here because he can't do all of the testing she needs. Pt reports pain started Friday. Pt reports the pain as an intermittent discomfort. Last BM today. Pt denies urinary ss.  Denies fevers, denies N/V, states diarrhea is not abnormal for her

## 2022-08-21 DIAGNOSIS — M778 Other enthesopathies, not elsewhere classified: Secondary | ICD-10-CM | POA: Diagnosis not present

## 2022-08-21 DIAGNOSIS — M65872 Other synovitis and tenosynovitis, left ankle and foot: Secondary | ICD-10-CM | POA: Diagnosis not present

## 2022-09-04 ENCOUNTER — Other Ambulatory Visit: Payer: Self-pay

## 2022-09-06 ENCOUNTER — Encounter: Payer: Self-pay | Admitting: Family Medicine

## 2022-09-06 ENCOUNTER — Ambulatory Visit: Payer: BC Managed Care – PPO | Admitting: Family Medicine

## 2022-09-06 VITALS — BP 132/82 | HR 60 | Temp 98.0°F | Resp 16 | Ht 61.5 in | Wt 226.0 lb

## 2022-09-06 DIAGNOSIS — E785 Hyperlipidemia, unspecified: Secondary | ICD-10-CM

## 2022-09-06 DIAGNOSIS — E559 Vitamin D deficiency, unspecified: Secondary | ICD-10-CM

## 2022-09-06 DIAGNOSIS — Z1159 Encounter for screening for other viral diseases: Secondary | ICD-10-CM

## 2022-09-06 DIAGNOSIS — E538 Deficiency of other specified B group vitamins: Secondary | ICD-10-CM

## 2022-09-06 DIAGNOSIS — J4541 Moderate persistent asthma with (acute) exacerbation: Secondary | ICD-10-CM

## 2022-09-06 DIAGNOSIS — Z8679 Personal history of other diseases of the circulatory system: Secondary | ICD-10-CM

## 2022-09-06 DIAGNOSIS — Z1211 Encounter for screening for malignant neoplasm of colon: Secondary | ICD-10-CM

## 2022-09-06 DIAGNOSIS — Z87898 Personal history of other specified conditions: Secondary | ICD-10-CM

## 2022-09-06 DIAGNOSIS — Z114 Encounter for screening for human immunodeficiency virus [HIV]: Secondary | ICD-10-CM

## 2022-09-06 MED ORDER — AIRSUPRA 90-80 MCG/ACT IN AERO
2.0000 | INHALATION_SPRAY | RESPIRATORY_TRACT | 3 refills | Status: AC | PRN
Start: 2022-09-06 — End: ?

## 2022-09-06 MED ORDER — EPINEPHRINE 0.3 MG/0.3ML IJ SOAJ: 0.3000 mg | Freq: Once | INTRAMUSCULAR | 0 refills | Status: AC | PRN

## 2022-09-06 NOTE — Telephone Encounter (Signed)
Printed and placed in provider box.

## 2022-09-06 NOTE — Patient Instructions (Addendum)
It was a pleasure meeting you today. Thank you for allowing me to take part in your health care.  Our goals for today as we discussed include:  ECHO ordered today for shortness of breath and history of cardiomyopathy  We will get some labs today.  If they are abnormal or we need to do something about them, I will call you.  If they are normal, I will send you a message on MyChart (if it is active) or a letter in the mail.  If you don't hear from [place] in 2 weeks, please call the office at the number below.   Stop Albuterol  Start Airsupra for shortness of breath or wheezing.  Not to exceed 12 inhalations in 12 hours  Follow up in 8 weeks   If you have any questions or concerns, please do not hesitate to call the office at [phone].  I look forward to our next visit and until then take care and stay safe.  Regards,   Dana Allan, MD   [facility]

## 2022-09-06 NOTE — Progress Notes (Signed)
SUBJECTIVE:   Chief Complaint  Patient presents with   Establish Care   HPI Presents to clinic to establish care  No acute concerns  Asthma No recent exacerbations.  Takes Albutereol as needed. Willing to try Airsupra.  Also taking Symbicort as needed.  Marland Kitchen  Heart palpitations/HTN/Peripartum Cardiomyopathy Takes Atenolol 12.5 mg daily and Lasix 20 mg daily.   Has seen cardiology in past.  Continues to have SOB on exertion.  Has not had recent ECHO.    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  Constitutional:      General: She is not in acute distress.    Appearance: She is not ill-appearing.  HENT:     Head: Normocephalic.     Right Ear: Tympanic membrane, ear canal and external ear normal.     Left Ear: Tympanic membrane, ear canal and external ear normal.     Nose: Nose normal.     Mouth/Throat:     Mouth: Mucous membranes are moist.  Eyes:     Extraocular Movements: Extraocular movements intact.     Conjunctiva/sclera: Conjunctivae normal.     Pupils: Pupils are equal, round, and reactive to light.  Neck:     Vascular: No carotid bruit.  Cardiovascular:     Rate and Rhythm: Normal rate and regular rhythm.     Pulses: Normal pulses.     Heart sounds: Murmur heard.  Pulmonary:     Effort: Pulmonary effort is normal.     Breath sounds: Normal breath sounds.  Abdominal:     General: Bowel sounds are normal. There is no distension.     Palpations: Abdomen is soft.     Tenderness: There is no abdominal tenderness. There is no right CVA tenderness, left CVA tenderness, guarding or rebound.  Musculoskeletal:        General: Normal range of motion.     Cervical back: Normal range of  motion.     Right lower leg: No edema.     Left lower leg: No edema.  Lymphadenopathy:     Cervical: No cervical adenopathy.  Skin:    Capillary Refill: Capillary refill takes less than 2 seconds.  Neurological:     General: No focal deficit present.     Mental Status: She is alert and oriented to person, place, and time. Mental status is at baseline.     Motor: No weakness.  Psychiatric:        Mood and Affect: Mood normal.        Behavior: Behavior normal.        Thought Content: Thought content normal.        Judgment: Judgment normal.        09/06/2022    1:21 PM  Depression screen PHQ 2/9  Decreased Interest 0  Down, Depressed, Hopeless 0  PHQ - 2 Score 0  Altered sleeping 0  Tired, decreased energy 1  Change in appetite 0  Feeling bad or failure about yourself  0  Trouble concentrating 0  Moving slowly or fidgety/restless 0  Suicidal thoughts 0  PHQ-9 Score 1  Difficult doing  work/chores Not difficult at all      09/06/2022    1:21 PM  GAD 7 : Generalized Anxiety Score  Nervous, Anxious, on Edge 0  Control/stop worrying 0  Worry too much - different things 0  Trouble relaxing 0  Restless 0  Easily annoyed or irritable 0  Afraid - awful might happen 0  Total GAD 7 Score 0  Anxiety Difficulty Not difficult at all    ASSESSMENT/PLAN:  Moderate persistent asthma with (acute) exacerbation Assessment & Plan: Chronic Discontinue Albuterol Start Airsupra Continue Symbicort 2 puffs BID Follow up as needed  Orders: -     Airsupra; Inhale 2 puffs into the lungs every 4 (four) hours as needed. Not to exceed 12 inhalations in 24 hours  Dispense: 5.9 g; Refill: 3  Hyperlipidemia, unspecified hyperlipidemia type -     Lipid panel  Morbid obesity (HCC) Assessment & Plan: Chronic Check labs   Orders: -     Comprehensive metabolic panel -     Hemoglobin A1c  Colon cancer screening -     Ambulatory referral to Gastroenterology  History of  cardiomyopathy Assessment & Plan: Mild systolic murmur noted on exam Will repeat ECHO   Orders: -     ECHOCARDIOGRAM COMPLETE; Future  History of palpitations Assessment & Plan: Chronic None since started BB Continue Atenolol 12.5 mg daily   Orders: -     TSH  Vitamin D deficiency -     VITAMIN D 25 Hydroxy (Vit-D Deficiency, Fractures)  Vitamin B 12 deficiency -     Vitamin B12  Need for hepatitis C screening test -     Hepatitis C antibody  Encounter for screening for HIV -     HIV Antibody (routine testing w rflx)  Other orders -     EPINEPHrine; Inject 0.3 mg into the muscle once as needed.  Dispense: 3 each; Refill: 0   PDMP reviewed  Return in about 2 months (around 11/06/2022) for PCP.  Dana Allan, MD

## 2022-09-13 ENCOUNTER — Encounter: Payer: Self-pay | Admitting: Gastroenterology

## 2022-09-13 ENCOUNTER — Encounter: Payer: Self-pay | Admitting: Obstetrics and Gynecology

## 2022-09-13 ENCOUNTER — Ambulatory Visit (INDEPENDENT_AMBULATORY_CARE_PROVIDER_SITE_OTHER): Payer: BC Managed Care – PPO | Admitting: Gastroenterology

## 2022-09-13 VITALS — BP 128/82 | HR 67 | Temp 98.1°F | Ht 61.5 in | Wt 230.0 lb

## 2022-09-13 DIAGNOSIS — R635 Abnormal weight gain: Secondary | ICD-10-CM

## 2022-09-13 DIAGNOSIS — R140 Abdominal distension (gaseous): Secondary | ICD-10-CM

## 2022-09-13 DIAGNOSIS — R152 Fecal urgency: Secondary | ICD-10-CM | POA: Diagnosis not present

## 2022-09-13 DIAGNOSIS — K219 Gastro-esophageal reflux disease without esophagitis: Secondary | ICD-10-CM | POA: Diagnosis not present

## 2022-09-13 DIAGNOSIS — K529 Noninfective gastroenteritis and colitis, unspecified: Secondary | ICD-10-CM

## 2022-09-13 NOTE — Progress Notes (Signed)
Grace Repress, MD [address]  [address]  [place], [place] [address]  Main: [phone]  Fax: [phone]    Gastroenterology Consultation  Referring Provider:     Alan Mulder, MD Primary Care Physician:  Dana Allan, MD Primary Gastroenterologist:  Dr. Arlyss [name] Reason for Consultation:     Chronic GERD, weight gain, postprandial diarrhea        HPI:   Grace [name] is a 57 y.o. female referred by Dr. Dana Allan, MD  for consultation & management of recent exacerbation of chronic GERD symptoms.  Patient reports that for last 1 year, she has been having increasing symptoms of reflux, frequent burping, substernal chest pain associated with severe abdominal bloating, postprandial urgency and loose stools.  Patient was on Ozempic which led to 15 pound weight loss last year.  Since she does not have diabetes, it is no longer covered by her insurance for weight loss.  Patient reports that she has been following strict 1200-calorie diet for the last 1 year, she does have rice and potato for dinner. However, she is not seeing any loss of weight and she appears to be frustrated.  She does walk about 30 minutes daily.  She had menopause at age 56 and is on estrogen replacement.  Her TSH is normal. Labs are unremarkable  Patient also reports that she is diagnosed with several food allergies as well as she has been experiencing shortness of breath.  She was told that her shortness of breath is secondary to asthma attacks from uncontrolled reflux  Follow-up visit 09/13/2022 Grace [name] is a 57 year old female is here for follow-up of postprandial diarrhea.  She recently had an episode of right upper quadrant pain associated with nausea that lasted for about a week, went to [facility] ER on 08/17/2022, labs including serum lipase, LFTs, CBC were unrevealing, right upper quadrant ultrasound revealed fatty liver only.  She is no longer having right upper quadrant discomfort.  She had a  cholecystectomy about 2 years ago, since then, she has been experiencing watery bowel movement after breakfast.  She denies any further bowel movements rest of the day.  She leads a sedentary lifestyle, works from home from 7:30 AM to 5 PM, has been noticing that her weight has been trending up.  Lately, she has been tracking her calories and macros, noticed that her fat intake is more than her protein intake.  She also takes omeprazole 20 mg daily to keep her GERD symptoms under control.  Otherwise, she does not have any GI concerns today  NSAIDs: None  Antiplts/Anticoagulants/Anti thrombotics: None  GI Procedures:  EGD 05/16/17 - Normal duodenal bulb and second portion of the duodenum. - Normal stomach. Biopsied. - Normal gastroesophageal junction and esophagus.   Colonoscopy 05/16/2017 - One 5 mm polyp in the rectum, removed with a cold snare. Resected and retrieved. - The entire examined colon is normal. - The distal rectum and anal verge are normal on retroflexion view. DIAGNOSIS:  A.  STOMACH; COLD BIOPSY:  - UNREMARKABLE ANTRAL MUCOSA.  - OXYNTIC MUCOSA WITH CHANGES CONSISTENT WITH PROTON PUMP INHIBITOR USE.  - NEGATIVE FOR H. PYLORI, DYSPLASIA, AND MALIGNANCY.   B.  RECTUM POLYP; COLD SNARE:  - HYPERPLASTIC POLYP.  - NEGATIVE FOR DYSPLASIA AND MALIGNANCY  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   Qualifier: Diagnosis of  By: Lindwood Qua CMA, Jerl Santos     Allergic rhinitis    Allergy  Asthma    Biliary colic    Bronchitis    Cardiomyopathy, peripartum, postpartum condition    THIS RESOLVED 6 MONTHS AFTER DELIVERY   Diabetes mellitus without complication (HCC)    GERD (gastroesophageal reflux disease) 05/07/2012   Heart valve regurgitation    Hx of dysplastic nevus 2019   multiple sites   HYPOGLYCEMIA 11/08/2007   Qualifier: Diagnosis of  By: Lindwood Qua CMA, Jerl Santos     Other chest pain 09/14/2009   Qualifier: Diagnosis of  By: Mariah Milling MD,  Tim      Past Surgical History:  Procedure Laterality Date   caesarean section     CESAREAN SECTION  12-05-2002   CHOLECYSTECTOMY N/A 05/21/2017   Procedure: LAPAROSCOPIC CHOLECYSTECTOMY;  Surgeon: Lattie Haw, MD;  Location: [facility] ORS;  Service: General;  Laterality: N/A;   COLONOSCOPY WITH PROPOFOL N/A 05/16/2017   Procedure: COLONOSCOPY WITH PROPOFOL;  Surgeon: Toney Reil, MD;  Location: [facility] ENDOSCOPY;  Service: Gastroenterology;  Laterality: N/A;   ESOPHAGOGASTRODUODENOSCOPY (EGD) WITH PROPOFOL N/A 05/16/2017   Procedure: ESOPHAGOGASTRODUODENOSCOPY (EGD) WITH PROPOFOL;  Surgeon: Toney Reil, MD;  Location: [facility] ENDOSCOPY;  Service: Gastroenterology;  Laterality: N/A;   htn     pulmonary edema with childbith, had cardiac w/ up   SPIROMETRY  12/2005   at allergist's- normal   WISDOM TOOTH EXTRACTION     Current Outpatient Medications:    Albuterol-Budesonide (AIRSUPRA) 90-80 MCG/ACT AERO, Inhale 2 puffs into the lungs every 4 (four) hours as needed. Not to exceed 12 inhalations in 24 hours, Disp: 5.9 g, Rfl: 3   atenolol (TENORMIN) 25 MG tablet, Take 25 mg by mouth daily., Disp: , Rfl:    Azelastine-Fluticasone 137-50 MCG/ACT SUSP, SPRAY 1 SPRAY INTO EACH NOSTRIL TWICE A DAY FOR 30 DAYS, Disp: , Rfl:    B Complex Vitamins (VITAMIN B COMPLEX) TABS, See admin instructions., Disp: , Rfl:    budesonide-formoterol (SYMBICORT) 80-4.5 MCG/ACT inhaler, SMARTSIG:2 Puff(s) By Mouth Twice Daily, Disp: , Rfl:    EPINEPHrine 0.3 mg/0.3 mL IJ SOAJ injection, Inject 0.3 mg into the muscle once as needed., Disp: 3 each, Rfl: 0   furosemide (LASIX) 20 MG tablet, TAKE 1 TABLET BY MOUTH EVERY DAY IN THE MORNING, Disp: , Rfl: 5   mometasone (NASONEX) 50 MCG/ACT nasal spray, Place into the nose., Disp: , Rfl:    Multiple Vitamins-Minerals (MULTIVITAL) tablet, Take 1 tablet by mouth daily.  , Disp: , Rfl:    omeprazole (PRILOSEC) 20 MG capsule, Take 1 capsule (20 mg total) by mouth 2  (two) times daily before a meal., Disp: 60 capsule, Rfl: 4   Vitamin D, Ergocalciferol, (DRISDOL) 50000 UNITS CAPS, Take 50,000 Units by mouth every 7 (seven) days., Disp: , Rfl:     Family History  Problem Relation Age of Onset   Heart disease Mother    Diabetes Mother    Hyperlipidemia Mother    Hypertension Mother    Heart attack Mother    Arthritis Father    Asthma Father    COPD Father    Cancer Father        lung   Diabetes Father    Hypertension Father    Hyperlipidemia Father    Breast cancer Maternal Aunt 46   Breast cancer Maternal Grandmother 39     Social History   Tobacco Use   Smoking status: Never   Smokeless tobacco: Never  Vaping Use   Vaping status: Never Used  Substance Use Topics  Alcohol use: Not Currently    Comment: occas   Drug use: No    Allergies as of 09/13/2022 - Review Complete 09/13/2022  Allergen Reaction Noted   Antihistamines, diphenhydramine-type  08/18/2020   Aspirin Other (See Comments) 09/10/2017   Cephalexin     Cetirizine Hives and Other (See Comments) 03/12/2012   Dramamine [dimenhydrinate] Other (See Comments) 09/17/2020   Egg-derived products Itching 01/30/2014   Gluten meal Swelling 01/30/2014   Meclizine Other (See Comments) 10/19/2020   Penicillins     Tilactase Itching 01/30/2014    Review of Systems:    All systems reviewed and negative except where noted in HPI.   Physical Exam:  BP 128/82   Pulse 67   Temp 98.1 F (36.7 C) (Oral)   Ht 5' 1.5" (1.562 m)   Wt 230 lb (104.3 kg)   LMP 05/18/2015 (Approximate)   BMI 42.75 kg/m  Patient's last menstrual period was 05/18/2015 (approximate).  General:   Alert,  Well-developed, well-nourished, pleasant and cooperative in NAD Head:  Normocephalic and atraumatic. Eyes:  Sclera clear, no icterus.   Conjunctiva pink. Ears:  Normal auditory acuity. Nose:  No deformity, discharge, or lesions. Mouth:  No deformity or lesions,oropharynx pink & moist. Neck:   Supple; no masses or thyromegaly. Lungs:  Respirations even and unlabored.  Clear throughout to auscultation.   No wheezes, crackles, or rhonchi. No acute distress. Heart:  Regular rate and rhythm; no murmurs, clicks, rubs, or gallops. Abdomen:  Normal bowel sounds. Soft, non-tender and non distended without masses, hepatosplenomegaly or hernias noted.  No guarding or rebound tenderness.   Rectal: Not performed Msk:  Symmetrical without gross deformities. Good, equal movement & strength bilaterally. Pulses:  Normal pulses noted. Extremities:  No clubbing or edema.  No cyanosis. Neurologic:  Alert and oriented x3;  grossly normal neurologically. Skin:  Intact without significant lesions or rashes. No jaundice. Psych:  Alert and cooperative. Normal mood and affect.  Imaging Studies: Reviewed  Assessment and Plan:   Grace [name] is a 57 y.o. pleasant Caucasian female with BMI 42.85, chronic GERD is seen in consultation for exacerbation of reflux as well as weight gain, abdominal bloating, postprandial urgency and loose stools.  Patient does not have any constitutional symptoms  Chronic GERD Symptoms under control on low-dose omeprazole EGD is unremarkable in 2019 With no alarm signs or symptoms, do not recommend repeat EGD Advised patient to try switching from omeprazole to Pepcid as needed Reiterated on GERD lifestyle, incorporate physical activity and try to lose weight by following healthy diet and exercise  Postprandial urgency and loose stools only after breakfast associated with some bloating No other constitutional signs or symptoms H. pylori breath test negative in the past Celiac disease panel normal Discussed with patient about trial of probiotics, information sent via MyChart Discussed about diet modification, low-fat, low-carb and lean protein  Weight gain after menopause TSH normal Discussed with her dietary management as well as exercise Advised her to cut back on  simple carbohydrates, gave her tips on complex carbs and protein diet Discussed about TD EE calculator  Do not recommend screening colonoscopy at this time Her colonoscopy in 2019 with adequate bowel prep, normal.  Hyperplastic polyp in the rectum was resected.  With no family history of colon cancer, she meets the criteria for average risk for colon cancer Recommend to repeat colonoscopy in 10 years for colon cancer screening   Follow up as needed   Grace Repress, MD

## 2022-09-24 ENCOUNTER — Encounter: Payer: Self-pay | Admitting: Family Medicine

## 2022-09-24 DIAGNOSIS — Z114 Encounter for screening for human immunodeficiency virus [HIV]: Secondary | ICD-10-CM | POA: Insufficient documentation

## 2022-09-24 DIAGNOSIS — E559 Vitamin D deficiency, unspecified: Secondary | ICD-10-CM | POA: Insufficient documentation

## 2022-09-24 DIAGNOSIS — Z1211 Encounter for screening for malignant neoplasm of colon: Secondary | ICD-10-CM | POA: Insufficient documentation

## 2022-09-24 DIAGNOSIS — Z8679 Personal history of other diseases of the circulatory system: Secondary | ICD-10-CM | POA: Insufficient documentation

## 2022-09-24 DIAGNOSIS — Z87898 Personal history of other specified conditions: Secondary | ICD-10-CM | POA: Insufficient documentation

## 2022-09-24 DIAGNOSIS — E538 Deficiency of other specified B group vitamins: Secondary | ICD-10-CM | POA: Insufficient documentation

## 2022-09-24 DIAGNOSIS — Z1159 Encounter for screening for other viral diseases: Secondary | ICD-10-CM | POA: Insufficient documentation

## 2022-09-24 NOTE — Assessment & Plan Note (Signed)
Chronic None since started BB Continue Atenolol 12.5 mg daily

## 2022-09-24 NOTE — Assessment & Plan Note (Signed)
Mild systolic murmur noted on exam Will repeat ECHO

## 2022-09-24 NOTE — Assessment & Plan Note (Signed)
Chronic. Check labs

## 2022-09-24 NOTE — Assessment & Plan Note (Signed)
Chronic Discontinue Albuterol Start Airsupra Continue Symbicort 2 puffs BID Follow up as needed

## 2022-10-05 ENCOUNTER — Ambulatory Visit (INDEPENDENT_AMBULATORY_CARE_PROVIDER_SITE_OTHER): Payer: BC Managed Care – PPO | Admitting: Dermatology

## 2022-10-05 ENCOUNTER — Encounter: Payer: Self-pay | Admitting: Dermatology

## 2022-10-05 VITALS — BP 117/65 | HR 66

## 2022-10-05 DIAGNOSIS — D229 Melanocytic nevi, unspecified: Secondary | ICD-10-CM

## 2022-10-05 DIAGNOSIS — L820 Inflamed seborrheic keratosis: Secondary | ICD-10-CM | POA: Diagnosis not present

## 2022-10-05 DIAGNOSIS — D2230 Melanocytic nevi of unspecified part of face: Secondary | ICD-10-CM | POA: Diagnosis not present

## 2022-10-05 DIAGNOSIS — L304 Erythema intertrigo: Secondary | ICD-10-CM | POA: Diagnosis not present

## 2022-10-05 MED ORDER — HYDROCORTISONE 2.5 % EX CREA
TOPICAL_CREAM | CUTANEOUS | 11 refills | Status: AC
Start: 2022-10-05 — End: ?

## 2022-10-05 NOTE — Progress Notes (Unsigned)
   Follow-Up Visit   Subjective  Grace [name] is a 57 y.o. female who presents for the following: Noticed new spot at lft forearm,  Weird rash under left breast started around mammogram started as itchy bumps 3 - 4 weeks ago  Itchy raised spot left flank   Mole at face concerned that may have grown larger more raised.     The patient has spots, moles and lesions to be evaluated, some may be new or changing and the patient may have concern these could be cancer.   The following portions of the chart were reviewed this encounter and updated as appropriate: medications, allergies, medical history  Review of Systems:  No other skin or systemic complaints except as noted in HPI or Assessment and Plan.  Objective  Well appearing patient in no apparent distress; mood and affect are within normal limits.  A focused examination was performed of the following areas: Left flank, left forearm, left breast , face  Relevant exam findings are noted in the Assessment and Plan.  left forearm x 1,  left flank x 1 (2) Erythematous stuck-on, waxy papule or plaque    Assessment & Plan     Inflamed seborrheic keratosis (2) left forearm x 1,  left flank x 1  Symptomatic, irritating, patient would like treated.   Will consider bx at left forearm if is not resolved.   Destruction of lesion - left forearm x 1,  left flank x 1 (2)  Destruction method: cryotherapy   Informed consent: discussed and consent obtained   Lesion destroyed using liquid nitrogen: Yes   Region frozen until ice ball extended beyond lesion: Yes   Outcome: patient tolerated procedure well with no complications   Post-procedure details: wound care instructions given   Additional details:  Prior to procedure, discussed risks of blister formation, small wound, skin dyspigmentation, or rare scar following cryotherapy. Recommend Vaseline ointment to treated areas while healing.   Erythema intertrigo  Related  Medications hydrocortisone 2.5 % cream Apply topically to itchy rash at left breast twice daily for up to 2 weeks prn. Avoid face, groin, axilla. Topical steroids can cause thinning and lightening of the skin if they are used for too long in the same area.   INTERTRIGO Exam Erythematous macerated patches with slighter laceration at left breast  Chronic and persistent condition with duration or expected duration over one year. Condition is bothersome/symptomatic for patient. Currently flared.   Intertrigo is a chronic recurrent rash that occurs in skin fold areas that may be associated with friction; heat; moisture; yeast; fungus; and bacteria.  It is exacerbated by increased movement / activity; sweating; and higher atmospheric temperature.   Exam: well demarkated erythematous patch slight laceration   Treatment Plan: START hydrocortisone 2.5% cream use twice daily until clear. If persistent after 2 weeks send mychart message for stronger steroid  MELANOCYTIC NEVI at face  Exam: Tan-brown and/or pink-flesh-colored symmetric macules and papules  Treatment Plan: Benign appearing on exam today. Recommend observation. Call clinic for new or changing moles. Recommend daily use of broad spectrum spf 30+ sunscreen to sun-exposed areas.   Return for 1 year tbse .  I, Asher Muir, CMA, am acting as scribe for Elie Goody, MD.   Documentation: I have reviewed the above documentation for accuracy and completeness, and I agree with the above.  Elie Goody, MD

## 2022-10-05 NOTE — Patient Instructions (Addendum)
Topical steroids (such as triamcinolone, fluocinolone, fluocinonide, mometasone, clobetasol, halobetasol, betamethasone, hydrocortisone) can cause thinning and lightening of the skin if they are used for too long in the same area. Your physician has selected the right strength medicine for your problem and area affected on the body. Please use your medication only as directed by your physician to prevent side effects.     Seborrheic Keratosis  What causes seborrheic keratoses? Seborrheic keratoses are harmless, common skin growths that first appear during adult life.  As time goes by, more growths appear.  Some people may develop a large number of them.  Seborrheic keratoses appear on both covered and uncovered body parts.  They are not caused by sunlight.  The tendency to develop seborrheic keratoses can be inherited.  They vary in color from skin-colored to gray, brown, or even black.  They can be either smooth or have a rough, warty surface.   Seborrheic keratoses are superficial and look as if they were stuck on the skin.  Under the microscope this type of keratosis looks like layers upon layers of skin.  That is why at times the top layer may seem to fall off, but the rest of the growth remains and re-grows.    Treatment Seborrheic keratoses do not need to be treated, but can easily be removed in the office.  Seborrheic keratoses often cause symptoms when they rub on clothing or jewelry.  Lesions can be in the way of shaving.  If they become inflamed, they can cause itching, soreness, or burning.  Removal of a seborrheic keratosis can be accomplished by freezing, burning, or surgery. If any spot bleeds, scabs, or grows rapidly, please return to have it checked, as these can be an indication of a skin cancer.   Cryotherapy Aftercare  Wash gently with soap and water everyday.   Apply Vaseline and Band-Aid daily until healed.     Due to recent changes in healthcare laws, you may see results  of your pathology and/or laboratory studies on MyChart before the doctors have had a chance to review them. We understand that in some cases there may be results that are confusing or concerning to you. Please understand that not all results are received at the same time and often the doctors may need to interpret multiple results in order to provide you with the best plan of care or course of treatment. Therefore, we ask that you please give [place] 2 business days to thoroughly review all your results before contacting the office for clarification. Should we see a critical lab result, you will be contacted sooner.   If You Need Anything After Your Visit  If you have any questions or concerns for your doctor, please call our main line at [phone] and press option 4 to reach your doctor's medical assistant. If no one answers, please leave a voicemail as directed and we will return your call as soon as possible. Messages left after 4 pm will be answered the following business day.   You may also send [place] a message via MyChart. We typically respond to MyChart messages within 1-2 business days.  For prescription refills, please ask your pharmacy to contact our office. Our fax number is [phone].  If you have an urgent issue when the clinic is closed that cannot wait until the next business day, you can page your doctor at the number below.    Please note that while we do our best to be available for urgent issues  outside of office hours, we are not available 24/7.   If you have an urgent issue and are unable to reach [place], you may choose to seek medical care at your doctor's office, retail clinic, urgent care center, or emergency room.  If you have a medical emergency, please immediately call 911 or go to the emergency department.  Pager Numbers  - Dr. Gwen Pounds: [phone]  - Dr. Roseanne Reno: [phone]  - Dr. Katrinka Blazing: [phone]   In the event of inclement weather, please call our main line  at [phone] for an update on the status of any delays or closures.  Dermatology Medication Tips: Please keep the boxes that topical medications come in in order to help keep track of the instructions about where and how to use these. Pharmacies typically print the medication instructions only on the boxes and not directly on the medication tubes.   If your medication is too expensive, please contact our office at [phone] option 4 or send [place] a message through MyChart.   We are unable to tell what your co-pay for medications will be in advance as this is different depending on your insurance coverage. However, we may be able to find a substitute medication at lower cost or fill out paperwork to get insurance to cover a needed medication.   If a prior authorization is required to get your medication covered by your insurance company, please allow [place] 1-2 business days to complete this process.  Drug prices often vary depending on where the prescription is filled and some pharmacies may offer cheaper prices.  The website www.goodrx.com contains coupons for medications through different pharmacies. The prices here do not account for what the cost may be with help from insurance (it may be cheaper with your insurance), but the website can give you the price if you did not use any insurance.  - You can print the associated coupon and take it with your prescription to the pharmacy.  - You may also stop by our office during regular business hours and pick up a GoodRx coupon card.  - If you need your prescription sent electronically to a different pharmacy, notify our office through [facility] or by phone at [phone] option 4.     Si Usted Necesita Algo Despus de Su Visita  Tambin puede enviarnos un mensaje a travs de Clinical cytogeneticist. Por lo general respondemos a los mensajes de MyChart en el transcurso de 1 a 2 das hbiles.  Para renovar recetas, por favor pida a su farmacia que se  ponga en contacto con nuestra oficina. Annie Sable de fax es [place] [phone].  Si tiene un asunto urgente cuando la clnica est cerrada y que no puede esperar hasta el siguiente da hbil, puede llamar/localizar a su doctor(a) al nmero que aparece a continuacin.   Por favor, tenga en cuenta que aunque hacemos todo lo posible para estar disponibles para asuntos urgentes fuera del horario de [place], no estamos disponibles las 24 horas del da, los 7 [address]  [address] de la [place].   Si tiene un problema urgente y no puede comunicarse con nosotros, puede optar por buscar atencin mdica  en el consultorio de su doctor(a), en una clnica privada, en un centro de atencin urgente o en una sala de emergencias.  Si tiene Engineer, drilling, por favor llame inmediatamente al 911 o vaya a la sala de emergencias.  Nmeros de bper  - Dr. Gwen Pounds: [phone]  - Dra. Roseanne Reno: [phone]  - Dr. Katrinka Blazing: [phone]  En caso de inclemencias del [place], por favor llame a nuestra lnea principal al [phone] para una actualizacin sobre el [place] de cualquier retraso o cierre.  Consejos para la medicacin en dermatologa: Por favor, guarde las cajas en las que vienen los medicamentos de uso tpico para ayudarle a seguir las instrucciones sobre dnde y cmo usarlos. Las farmacias generalmente imprimen las instrucciones del medicamento slo en las cajas y no directamente en los tubos del [place].   Si su medicamento es muy caro, por favor, pngase en contacto con Rolm Gala llamando al [phone] y presione la opcin 4 o envenos un mensaje a travs de Clinical cytogeneticist.   No podemos decirle cul ser su copago por los medicamentos por adelantado ya que esto es diferente dependiendo de la cobertura de su seguro. Sin embargo, es posible que podamos encontrar un medicamento sustituto a Audiological scientist un formulario para que el seguro cubra el medicamento que se considera necesario.   Si se requiere  una autorizacin previa para que su compaa de seguros [place] su medicamento, por favor permtanos de 1 a 2 das hbiles para completar [address]  Los precios de los medicamentos varan con frecuencia dependiendo del Environmental consultant de dnde se surte la receta y alguna farmacias pueden ofrecer precios ms baratos.  El sitio web www.goodrx.com tiene cupones para medicamentos de Health and safety inspector. Los precios aqu no tienen en cuenta lo que podra costar con la ayuda del seguro (puede ser ms barato con su seguro), pero el sitio web puede darle el precio si no utiliz Tourist information centre manager.  - Puede imprimir el cupn correspondiente y llevarlo con su receta a la farmacia.  - Tambin puede pasar por nuestra oficina durante el horario de atencin regular y Education officer, museum una tarjeta de cupones de GoodRx.  - Si necesita que su receta se enve electrnicamente a una farmacia diferente, informe a nuestra oficina a travs de MyChart de [place] o por telfono llamando al [phone] y presione la opcin 4.     Due to recent changes in healthcare laws, you may see results of your pathology and/or laboratory studies on MyChart before the doctors have had a chance to review them. We understand that in some cases there may be results that are confusing or concerning to you. Please understand that not all results are received at the same time and often the doctors may need to interpret multiple results in order to provide you with the best plan of care or course of treatment. Therefore, we ask that you please give [place] 2 business days to thoroughly review all your results before contacting the office for clarification. Should we see a critical lab result, you will be contacted sooner.   If You Need Anything After Your Visit  If you have any questions or concerns for your doctor, please call our main line at [phone] and press option 4 to reach your doctor's medical assistant. If no one answers, please leave a voicemail as  directed and we will return your call as soon as possible. Messages left after 4 pm will be answered the following business day.   You may also send [place] a message via MyChart. We typically respond to MyChart messages within 1-2 business days.  For prescription refills, please ask your pharmacy to contact our office. Our fax number is [phone].  If you have an urgent issue when the clinic is closed that cannot wait until the next business day, you can page your doctor at the number  below.    Please note that while we do our best to be available for urgent issues outside of office hours, we are not available 24/7.   If you have an urgent issue and are unable to reach [place], you may choose to seek medical care at your doctor's office, retail clinic, urgent care center, or emergency room.  If you have a medical emergency, please immediately call 911 or go to the emergency department.  Pager Numbers  - Dr. Gwen Pounds: [phone]  - Dr. Roseanne Reno: [phone]  - Dr. Katrinka Blazing: [phone]   In the event of inclement weather, please call our main line at [phone] for an update on the status of any delays or closures.  Dermatology Medication Tips: Please keep the boxes that topical medications come in in order to help keep track of the instructions about where and how to use these. Pharmacies typically print the medication instructions only on the boxes and not directly on the medication tubes.   If your medication is too expensive, please contact our office at [phone] option 4 or send [place] a message through MyChart.   We are unable to tell what your co-pay for medications will be in advance as this is different depending on your insurance coverage. However, we may be able to find a substitute medication at lower cost or fill out paperwork to get insurance to cover a needed medication.   If a prior authorization is required to get your medication covered by your insurance company, please  allow [place] 1-2 business days to complete this process.  Drug prices often vary depending on where the prescription is filled and some pharmacies may offer cheaper prices.  The website www.goodrx.com contains coupons for medications through different pharmacies. The prices here do not account for what the cost may be with help from insurance (it may be cheaper with your insurance), but the website can give you the price if you did not use any insurance.  - You can print the associated coupon and take it with your prescription to the pharmacy.  - You may also stop by our office during regular business hours and pick up a GoodRx coupon card.  - If you need your prescription sent electronically to a different pharmacy, notify our office through [facility] or by phone at [phone] option 4.     Si Usted Necesita Algo Despus de Su Visita  Tambin puede enviarnos un mensaje a travs de Clinical cytogeneticist. Por lo general respondemos a los mensajes de MyChart en el transcurso de 1 a 2 das hbiles.  Para renovar recetas, por favor pida a su farmacia que se ponga en contacto con nuestra oficina. Annie Sable de fax es [place] [phone].  Si tiene un asunto urgente cuando la clnica est cerrada y que no puede esperar hasta el siguiente da hbil, puede llamar/localizar a su doctor(a) al nmero que aparece a continuacin.   Por favor, tenga en cuenta que aunque hacemos todo lo posible para estar disponibles para asuntos urgentes fuera del horario de [place], no estamos disponibles las 24 horas del da, los 7 [address]  [address] de la [place].   Si tiene un problema urgente y no puede comunicarse con nosotros, puede optar por buscar atencin mdica  en el consultorio de su doctor(a), en una clnica privada, en un centro de atencin urgente o en una sala de emergencias.  Si tiene Engineer, drilling, por favor llame inmediatamente al 911 o vaya a la sala de emergencias.  Nmeros de bper  -  Dr. Gwen Pounds:  [phone]  - Dra. Roseanne Reno: [phone]  - Dr. Katrinka Blazing: [phone]   En caso de inclemencias del tiempo, por favor llame a Lacy Duverney principal al [phone] para una actualizacin sobre el [place] de cualquier retraso o cierre.  Consejos para la medicacin en dermatologa: Por favor, guarde las cajas en las que vienen los medicamentos de uso tpico para ayudarle a seguir las instrucciones sobre dnde y cmo usarlos. Las farmacias generalmente imprimen las instrucciones del medicamento slo en las cajas y no directamente en los tubos del [place].   Si su medicamento es muy caro, por favor, pngase en contacto con Rolm Gala llamando al [phone] y presione la opcin 4 o envenos un mensaje a travs de Clinical cytogeneticist.   No podemos decirle cul ser su copago por los medicamentos por adelantado ya que esto es diferente dependiendo de la cobertura de su seguro. Sin embargo, es posible que podamos encontrar un medicamento sustituto a Audiological scientist un formulario para que el seguro cubra el medicamento que se considera necesario.   Si se requiere una autorizacin previa para que su compaa de seguros [place] su medicamento, por favor permtanos de 1 a 2 das hbiles para completar [address]  Los precios de los medicamentos varan con frecuencia dependiendo del Environmental consultant de dnde se surte la receta y alguna farmacias pueden ofrecer precios ms baratos.  El sitio web www.goodrx.com tiene cupones para medicamentos de Health and safety inspector. Los precios aqu no tienen en cuenta lo que podra costar con la ayuda del seguro (puede ser ms barato con su seguro), pero el sitio web puede darle el precio si no utiliz Tourist information centre manager.  - Puede imprimir el cupn correspondiente y llevarlo con su receta a la farmacia.  - Tambin puede pasar por nuestra oficina durante el horario de atencin regular y Education officer, museum una tarjeta de cupones de GoodRx.  - Si necesita que su receta se enve electrnicamente a  una farmacia diferente, informe a nuestra oficina a travs de MyChart de [place] o por telfono llamando al [phone] y presione la opcin 4.

## 2022-10-09 ENCOUNTER — Encounter: Payer: Self-pay | Admitting: Dermatology

## 2022-10-10 MED ORDER — MUPIROCIN 2 % EX OINT: TOPICAL_OINTMENT | CUTANEOUS | 0 refills | Status: AC

## 2022-10-11 ENCOUNTER — Ambulatory Visit
Admission: RE | Admit: 2022-10-11 | Discharge: 2022-10-11 | Disposition: A | Discharge status: Home / Self Care | Payer: BC Managed Care – PPO | Source: Ambulatory Visit | Attending: Family Medicine | Admitting: Family Medicine

## 2022-10-11 DIAGNOSIS — R0602 Shortness of breath: Secondary | ICD-10-CM | POA: Diagnosis not present

## 2022-10-11 DIAGNOSIS — I429 Cardiomyopathy, unspecified: Secondary | ICD-10-CM | POA: Insufficient documentation

## 2022-10-11 DIAGNOSIS — Z8679 Personal history of other diseases of the circulatory system: Secondary | ICD-10-CM | POA: Diagnosis not present

## 2022-10-11 DIAGNOSIS — I340 Nonrheumatic mitral (valve) insufficiency: Secondary | ICD-10-CM | POA: Insufficient documentation

## 2022-10-11 DIAGNOSIS — E119 Type 2 diabetes mellitus without complications: Secondary | ICD-10-CM | POA: Insufficient documentation

## 2022-10-11 DIAGNOSIS — R0600 Dyspnea, unspecified: Secondary | ICD-10-CM | POA: Diagnosis not present

## 2022-10-11 LAB — ECHOCARDIOGRAM COMPLETE
AR max vel: 1.71 cm2
AV Area VTI: 1.87 cm2
AV Area mean vel: 1.61 cm2
AV Mean grad: 5.7 mmHg
AV Peak grad: 9.9 mmHg
Ao pk vel: 1.57 m/s
Area-P 1/2: 4.17 cm2
MV VTI: 1.87 cm2
S' Lateral: 3.2 cm

## 2022-10-11 NOTE — Progress Notes (Signed)
*  PRELIMINARY RESULTS* Echocardiogram 2D Echocardiogram has been performed.  Cristela Blue 10/11/2022, 11:38 AM

## 2022-11-07 ENCOUNTER — Encounter: Payer: Self-pay | Admitting: Family Medicine

## 2022-11-07 ENCOUNTER — Ambulatory Visit: Payer: BC Managed Care – PPO | Admitting: Family Medicine

## 2022-11-07 DIAGNOSIS — Z8679 Personal history of other diseases of the circulatory system: Secondary | ICD-10-CM

## 2022-11-07 DIAGNOSIS — R0602 Shortness of breath: Secondary | ICD-10-CM

## 2022-11-07 DIAGNOSIS — K219 Gastro-esophageal reflux disease without esophagitis: Secondary | ICD-10-CM

## 2022-11-07 DIAGNOSIS — Z87898 Personal history of other specified conditions: Secondary | ICD-10-CM | POA: Diagnosis not present

## 2022-11-07 NOTE — Patient Instructions (Addendum)
It was a pleasure meeting you today. Thank you for allowing me to take part in your health care.  Our goals for today as we discussed include:  Stop Lasix  Monitor for increased swelling legs Compression stockings Limit sodium intake  Decrease Atenolol 12.5 mg every other day and continue to wean. If any palpitations please go back to original dose.  Can trial over the counter Pepcid in addition to Omeprazole to see if will help with globus sensation  Let me know about the referral for sleep studies   PAP due.  Please schedule appointment   This is a list of the screening recommended for you and due dates:  Health Maintenance  Topic Date Due   HIV Screening  Never done   Hepatitis C Screening  Never done   Pap with HPV screening  02/03/2020   COVID-19 Vaccine (5 - 2023-24 season) 09/24/2022   Flu Shot  04/23/2023*   Mammogram  06/08/2024   Colon Cancer Screening  05/17/2027   DTaP/Tdap/Td vaccine (3 - Tdap) 09/26/2028   Zoster (Shingles) Vaccine  Completed   HPV Vaccine  Aged Out  *Topic was postponed. The date shown is not the original due date.      If you have any questions or concerns, please do not hesitate to call the office at [phone].  I look forward to our next visit and until then take care and stay safe.  Regards,   Dana Allan, MD   [facility]

## 2022-11-07 NOTE — Progress Notes (Unsigned)
SUBJECTIVE:   Chief Complaint  Patient presents with   Asthma   HPI Presents to clinic for follow up chronic disease  Discussed the use of AI scribe software for clinical note transcription with the patient, who gave verbal consent to proceed.  History of Present Illness The patient, previously managed by Dr. Johny Chess, presents for a follow-up consultation regarding their ongoing treatment with atenolol and Lasix. The patient was initially prescribed these medications for palpitations and shortness of breath, which they describe as a sensation of something in their throat. The patient has a history of asthma, but expresses uncertainty about the diagnosis, as they feel their symptoms may be related to acid reflux.  The patient is open to discontinuing the medication, but expresses concern about potential withdrawal symptoms and the return of palpitations.  The patient also takes Lasix daily for leg swelling, but is unsure of its effectiveness. They admit to a high sodium diet, which may contribute to the swelling.  The patient has a history of anxiety, for which they were previously treated with Paxil and Xanax. They describe a period of feeling like they were in a coma while on these medications, and have since discontinued them. The patient also mentions a history of bronchitis, typically occurring in the late fall and early winter months.  The patient is clinically obese and has recently entered menopause, during which they experienced hives and significant weight gain. They attempted an elimination diet, which resulted in a 40-pound weight loss, but found it difficult to maintain. The patient acknowledges a preference for convenience foods, which are often high in sodium.  The patient also reports a history of pneumonia and has been vaccinated for shingles. They express concern about their bone density, as a recent test indicated they were on the verge of osteopenia. The patient suspects  this may be related to their use of Lasix.  In summary, the patient presents with a complex medical history, including palpitations, shortness of breath, leg swelling, anxiety, bronchitis, obesity, and menopause. They are currently on atenolol and Lasix, but are open to lifestyle modifications and medication changes to better manage their symptoms.    PERTINENT PMH / PSH: As above  OBJECTIVE:  BP 120/70   Pulse 64   Temp 98 F (36.7 C)   Resp 16   Ht 5' 1.75" (1.568 m)   Wt 232 lb (105.2 kg)   LMP 05/18/2015 (Approximate)   SpO2 97%   BMI 42.78 kg/m    Physical Exam Vitals reviewed.  Constitutional:      General: She is not in acute distress.    Appearance: Normal appearance. She is obese. She is not ill-appearing, toxic-appearing or diaphoretic.  Eyes:     General:        Right eye: No discharge.        Left eye: No discharge.     Conjunctiva/sclera: Conjunctivae normal.  Cardiovascular:     Rate and Rhythm: Normal rate and regular rhythm.     Heart sounds: Normal heart sounds. No murmur heard. Pulmonary:     Effort: Pulmonary effort is normal.     Breath sounds: Normal breath sounds. No wheezing.  Abdominal:     General: Bowel sounds are normal.  Musculoskeletal:        General: Normal range of motion.  Skin:    General: Skin is warm and dry.  Neurological:     General: No focal deficit present.     Mental Status: She is  alert and oriented to person, place, and time. Mental status is at baseline.  Psychiatric:        Mood and Affect: Mood normal.        Behavior: Behavior normal.        Thought Content: Thought content normal.        Judgment: Judgment normal.        11/07/2022    2:52 PM 09/06/2022    1:21 PM  Depression screen PHQ 2/9  Decreased Interest 0 0  Down, Depressed, Hopeless 0 0  PHQ - 2 Score 0 0  Altered sleeping 0 0  Tired, decreased energy 1 1  Change in appetite 0 0  Feeling bad or failure about yourself  0 0  Trouble concentrating 1  0  Moving slowly or fidgety/restless 0 0  Suicidal thoughts 0 0  PHQ-9 Score 2 1  Difficult doing work/chores Not difficult at all Not difficult at all      11/07/2022    2:53 PM 09/06/2022    1:21 PM  GAD 7 : Generalized Anxiety Score  Nervous, Anxious, on Edge 0 0  Control/stop worrying 0 0  Worry too much - different things 0 0  Trouble relaxing 0 0  Restless 0 0  Easily annoyed or irritable 0 0  Afraid - awful might happen 0 0  Total GAD 7 Score 0 0  Anxiety Difficulty Not difficult at all Not difficult at all    ASSESSMENT/PLAN:  Morbid obesity (HCC) Assessment & Plan: Chronic.  Elevated BMI Encouraged healthy lifestyle    History of palpitations Assessment & Plan: Managed with Atenolol. No current symptoms. Discussed weaning off Atenolol due to patient's desire to discontinue. -Wean off Atenolol gradually over several weeks, monitoring for return of palpitations.   DYSPNEA Assessment & Plan: Currently managed with Airsupra. No current symptoms. -Continue Airsupra as needed, particularly during "bronchitis season."   Gastroesophageal reflux disease, unspecified whether esophagitis present Assessment & Plan: Symptoms of acid reflux and globus sensation. Currently on Omeprazole. -Add over-the-counter Pepcid twice daily to current regimen.   History of cardiomyopathy Assessment & Plan: Managed with daily Lasix for presumed history of leg edema. Recent ECHO normal.  LVEF 55-60%, no valvular disease -Discontinue daily Lasix. -Implement compression stockings, increased walking, weight loss, and reduced sodium intake. -Use Lasix as needed for significant swelling or shortness of breath.     General Health Maintenance -Consider Pneumonia 20 vaccine due to history of asthma. -Consider sleep apnea screening due to reported fatigue and potential nocturnal symptoms. Epworth score 7   PDMP reviewed  Return if symptoms worsen or fail to improve, for  PCP.  Dana Allan, MD

## 2022-11-08 ENCOUNTER — Encounter: Payer: Self-pay | Admitting: Family Medicine

## 2022-11-08 NOTE — Assessment & Plan Note (Signed)
Symptoms of acid reflux and globus sensation. Currently on Omeprazole. -Add over-the-counter Pepcid twice daily to current regimen.

## 2022-11-08 NOTE — Assessment & Plan Note (Addendum)
Managed with daily Lasix for presumed history of leg edema. Recent ECHO normal.  LVEF 55-60%, no valvular disease -Discontinue daily Lasix. -Implement compression stockings, increased walking, weight loss, and reduced sodium intake. -Use Lasix as needed for significant swelling or shortness of breath.

## 2022-11-08 NOTE — Assessment & Plan Note (Signed)
Chronic.  Elevated BMI Encouraged healthy lifestyle

## 2022-11-08 NOTE — Assessment & Plan Note (Addendum)
Managed with Atenolol. No current symptoms. Discussed weaning off Atenolol due to patient's desire to discontinue. -Wean off Atenolol gradually over several weeks, monitoring for return of palpitations.

## 2022-11-08 NOTE — Assessment & Plan Note (Signed)
Currently managed with Airsupra. No current symptoms. -Continue Airsupra as needed, particularly during "bronchitis season."

## 2022-12-26 ENCOUNTER — Other Ambulatory Visit (HOSPITAL_COMMUNITY)
Admission: RE | Admit: 2022-12-26 | Discharge: 2022-12-26 | Disposition: A | Discharge status: Home / Self Care | Payer: BC Managed Care – PPO | Source: Ambulatory Visit | Attending: Family Medicine | Admitting: Family Medicine

## 2022-12-26 ENCOUNTER — Ambulatory Visit: Payer: BC Managed Care – PPO | Admitting: Family Medicine

## 2022-12-26 ENCOUNTER — Encounter: Payer: Self-pay | Admitting: Family Medicine

## 2022-12-26 VITALS — BP 136/80 | HR 76 | Temp 98.3°F | Ht 61.75 in | Wt 232.0 lb

## 2022-12-26 DIAGNOSIS — R87810 Cervical high risk human papillomavirus (HPV) DNA test positive: Secondary | ICD-10-CM

## 2022-12-26 DIAGNOSIS — Z114 Encounter for screening for human immunodeficiency virus [HIV]: Secondary | ICD-10-CM

## 2022-12-26 DIAGNOSIS — Z124 Encounter for screening for malignant neoplasm of cervix: Secondary | ICD-10-CM | POA: Diagnosis not present

## 2022-12-26 DIAGNOSIS — E785 Hyperlipidemia, unspecified: Secondary | ICD-10-CM

## 2022-12-26 DIAGNOSIS — E538 Deficiency of other specified B group vitamins: Secondary | ICD-10-CM

## 2022-12-26 DIAGNOSIS — Z1159 Encounter for screening for other viral diseases: Secondary | ICD-10-CM | POA: Diagnosis not present

## 2022-12-26 DIAGNOSIS — E559 Vitamin D deficiency, unspecified: Secondary | ICD-10-CM

## 2022-12-26 DIAGNOSIS — R7309 Other abnormal glucose: Secondary | ICD-10-CM

## 2022-12-26 NOTE — Patient Instructions (Addendum)
It was a pleasure meeting you today. Thank you for allowing me to take part in your health care.  Our goals for today as we discussed include:  Will MyChart you the results of  PAP today.    Please schedule lab appointment.  Fast for 10 hours  This is a list of the screening recommended for you and due dates:  Health Maintenance  Topic Date Due   HIV Screening  Never done   Hepatitis C Screening  Never done   Pap with HPV screening  02/02/2022   COVID-19 Vaccine (5 - 2023-24 season) 09/24/2022   Flu Shot  04/23/2023*   Mammogram  06/08/2024   Colon Cancer Screening  05/17/2027   DTaP/Tdap/Td vaccine (3 - Tdap) 09/26/2028   Zoster (Shingles) Vaccine  Completed   HPV Vaccine  Aged Out  *Topic was postponed. The date shown is not the original due date.     Follow up as needed   If you have any questions or concerns, please do not hesitate to call the office at [phone].  I look forward to our next visit and until then take care and stay safe.  Regards,   Dana Allan, MD   [facility]

## 2022-12-26 NOTE — Progress Notes (Signed)
SUBJECTIVE:   Chief Complaint  Patient presents with   Gynecologic Exam   HPI Presents for PAP smear   Discussed the use of AI scribe software for clinical note transcription with the patient, who gave verbal consent to proceed.  History of Present Illness The patient, with a history of menopause and vaginal atrophy, presented for a routine Pap smear. She reported no abnormal vaginal discharge or bleeding. The patient has not been sexually active for several years due to discomfort associated with vaginal atrophy. She also mentioned a lack of sexual drive, which she attributed to menopause.  During the Pap smear, the patient experienced significant discomfort due to vaginal tightness, likely a result of the aforementioned atrophy. The patient's cervix was noted to be positioned further back, making the procedure more challenging. Some minor bleeding occurred due to the disruption during the procedure, but the patient was advised that this should resolve within a few days.  The patient had no further questions or concerns at the time of the consultation.     PERTINENT PMH / PSH: As above  OBJECTIVE:  BP 136/80   Pulse 76   Temp 98.3 F (36.8 C) (Oral)   Ht 5' 1.75" (1.568 m)   Wt 232 lb (105.2 kg)   LMP 05/18/2015 (Approximate)   SpO2 99%   BMI 42.78 kg/m    Physical Exam Vitals reviewed. Exam conducted with a chaperone present.  Constitutional:      Appearance: She is obese.  Genitourinary:    General: Normal vulva.     Exam position: Lithotomy position.     Labia:        Right: No rash, tenderness or lesion.        Left: No rash, tenderness or lesion.      Vagina: Normal.     Cervix: Normal.     Uterus: Normal.      Adnexa: Right adnexa normal and left adnexa normal.  Neurological:     Mental Status: She is alert.        12/26/2022   11:07 AM 11/07/2022    2:52 PM 09/06/2022    1:21 PM  Depression screen PHQ 2/9  Decreased Interest 0 0 0  Down,  Depressed, Hopeless 0 0 0  PHQ - 2 Score 0 0 0  Altered sleeping 0 0 0  Tired, decreased energy 1 1 1   Change in appetite 0 0 0  Feeling bad or failure about yourself  0 0 0  Trouble concentrating 0 1 0  Moving slowly or fidgety/restless 0 0 0  Suicidal thoughts 0 0 0  PHQ-9 Score 1 2 1   Difficult doing work/chores Not difficult at all Not difficult at all Not difficult at all      12/26/2022   11:07 AM 11/07/2022    2:53 PM 09/06/2022    1:21 PM  GAD 7 : Generalized Anxiety Score  Nervous, Anxious, on Edge 1 0 0  Control/stop worrying 1 0 0  Worry too much - different things 1 0 0  Trouble relaxing 1 0 0  Restless 0 0 0  Easily annoyed or irritable 1 0 0  Afraid - awful might happen 0 0 0  Total GAD 7 Score 5 0 0  Anxiety Difficulty Not difficult at all Not difficult at all Not difficult at all    ASSESSMENT/PLAN:  Cervical cancer screening Assessment & Plan: No abnormal vaginal discharge or bleeding. Difficulty with speculum insertion due to vaginal atrophy and  patient's discomfort. Cervix was eventually visualized and Pap smear with HPV testing was performed. Last PAP 2019, NILM, HRHPV negative  Addendum Recent PAP NILM, HRHPV positive, genotype 16, 18/14 negative.  Per ASCCP guidelines clinical judgement. Will refer to OBGYN for completion of evaluation.     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  Dana Allan, MD

## 2022-12-29 ENCOUNTER — Telehealth: Payer: Self-pay

## 2022-12-29 ENCOUNTER — Other Ambulatory Visit: Payer: BC Managed Care – PPO

## 2022-12-29 LAB — CYTOLOGY - PAP
Comment: NEGATIVE
Comment: NEGATIVE
Comment: NEGATIVE
Diagnosis: NEGATIVE
HPV 16: NEGATIVE
HPV 18 / 45: NEGATIVE
High risk HPV: POSITIVE — AB

## 2022-12-29 NOTE — Telephone Encounter (Signed)
Pt seen where her results said High risk HPV.

## 2022-12-29 NOTE — Telephone Encounter (Signed)
Patient states she saw her lab result on MyChart and she had a question for Dr. Dana Allan.  Patient states she would like for [place] to please call her.

## 2022-12-31 ENCOUNTER — Encounter: Payer: Self-pay | Admitting: Family Medicine

## 2022-12-31 DIAGNOSIS — Z124 Encounter for screening for malignant neoplasm of cervix: Secondary | ICD-10-CM | POA: Insufficient documentation

## 2022-12-31 DIAGNOSIS — R87810 Cervical high risk human papillomavirus (HPV) DNA test positive: Secondary | ICD-10-CM | POA: Insufficient documentation

## 2022-12-31 DIAGNOSIS — R7309 Other abnormal glucose: Secondary | ICD-10-CM | POA: Insufficient documentation

## 2022-12-31 NOTE — Assessment & Plan Note (Addendum)
No abnormal vaginal discharge or bleeding. Difficulty with speculum insertion due to vaginal atrophy and patient's discomfort. Cervix was eventually visualized and Pap smear with HPV testing was performed. Last PAP 2019, NILM, HRHPV negative  Addendum Recent PAP NILM, HRHPV positive, genotype 16, 18/14 negative.  Per ASCCP guidelines clinical judgement. Will refer to OBGYN for completion of evaluation.

## 2023-01-01 NOTE — Telephone Encounter (Signed)
Pt is calling back to schedule a virtual visit with Dr. Clent Ridges to discuss pap results.

## 2023-01-01 NOTE — Telephone Encounter (Signed)
Pt wanted to know what HPV is. She stated that she has not been sexually active in 4 years. I will advise her to make an appointment to discuss further.

## 2023-01-22 ENCOUNTER — Ambulatory Visit: Payer: BC Managed Care – PPO | Admitting: Family Medicine

## 2023-01-22 ENCOUNTER — Other Ambulatory Visit: Payer: BC Managed Care – PPO

## 2023-01-28 DIAGNOSIS — Z1331 Encounter for screening for depression: Secondary | ICD-10-CM | POA: Diagnosis not present

## 2023-01-28 DIAGNOSIS — N760 Acute vaginitis: Secondary | ICD-10-CM | POA: Diagnosis not present

## 2023-01-29 DIAGNOSIS — I10 Essential (primary) hypertension: Secondary | ICD-10-CM | POA: Insufficient documentation

## 2023-01-31 DIAGNOSIS — Z6841 Body Mass Index (BMI) 40.0 and over, adult: Secondary | ICD-10-CM | POA: Diagnosis not present

## 2023-01-31 DIAGNOSIS — N760 Acute vaginitis: Secondary | ICD-10-CM | POA: Diagnosis not present

## 2023-01-31 DIAGNOSIS — Z01419 Encounter for gynecological examination (general) (routine) without abnormal findings: Secondary | ICD-10-CM | POA: Diagnosis not present

## 2023-03-21 DIAGNOSIS — L600 Ingrowing nail: Secondary | ICD-10-CM | POA: Diagnosis not present

## 2023-03-21 DIAGNOSIS — S90212A Contusion of left great toe with damage to nail, initial encounter: Secondary | ICD-10-CM | POA: Diagnosis not present

## 2023-04-01 ENCOUNTER — Emergency Department
Admission: EM | Admit: 2023-04-01 | Discharge: 2023-04-01 | Disposition: A | Discharge status: Home / Self Care | Attending: Student in an Organized Health Care Education/Training Program | Admitting: Student in an Organized Health Care Education/Training Program

## 2023-04-01 ENCOUNTER — Other Ambulatory Visit: Payer: Self-pay

## 2023-04-01 ENCOUNTER — Emergency Department

## 2023-04-01 DIAGNOSIS — M79601 Pain in right arm: Secondary | ICD-10-CM | POA: Diagnosis not present

## 2023-04-01 DIAGNOSIS — R1011 Right upper quadrant pain: Secondary | ICD-10-CM | POA: Diagnosis not present

## 2023-04-01 DIAGNOSIS — K5792 Diverticulitis of intestine, part unspecified, without perforation or abscess without bleeding: Secondary | ICD-10-CM | POA: Diagnosis not present

## 2023-04-01 DIAGNOSIS — K760 Fatty (change of) liver, not elsewhere classified: Secondary | ICD-10-CM | POA: Diagnosis not present

## 2023-04-01 DIAGNOSIS — M25511 Pain in right shoulder: Secondary | ICD-10-CM | POA: Diagnosis not present

## 2023-04-01 DIAGNOSIS — R1031 Right lower quadrant pain: Secondary | ICD-10-CM | POA: Diagnosis not present

## 2023-04-01 LAB — URINALYSIS, ROUTINE W REFLEX MICROSCOPIC
Bacteria, UA: NONE SEEN
Bilirubin Urine: NEGATIVE
Glucose, UA: NEGATIVE mg/dL
Hgb urine dipstick: NEGATIVE
Ketones, ur: 5 mg/dL — AB
Nitrite: NEGATIVE
Protein, ur: NEGATIVE mg/dL
Specific Gravity, Urine: 1.023 (ref 1.005–1.030)
pH: 5 (ref 5.0–8.0)

## 2023-04-01 LAB — CBC
HCT: 40 % (ref 36.0–46.0)
Hemoglobin: 13.4 g/dL (ref 12.0–15.0)
MCH: 29 pg (ref 26.0–34.0)
MCHC: 33.5 g/dL (ref 30.0–36.0)
MCV: 86.6 fL (ref 80.0–100.0)
Platelets: 249 10*3/uL (ref 150–400)
RBC: 4.62 MIL/uL (ref 3.87–5.11)
RDW: 15.3 % (ref 11.5–15.5)
WBC: 9.1 10*3/uL (ref 4.0–10.5)
nRBC: 0 % (ref 0.0–0.2)

## 2023-04-01 LAB — COMPREHENSIVE METABOLIC PANEL
ALT: 27 U/L (ref 0–44)
AST: 25 U/L (ref 15–41)
Albumin: 3.6 g/dL (ref 3.5–5.0)
Alkaline Phosphatase: 85 U/L (ref 38–126)
Anion gap: 7 (ref 5–15)
BUN: 11 mg/dL (ref 6–20)
CO2: 24 mmol/L (ref 22–32)
Calcium: 8.7 mg/dL — ABNORMAL LOW (ref 8.9–10.3)
Chloride: 109 mmol/L (ref 98–111)
Creatinine, Ser: 0.8 mg/dL (ref 0.44–1.00)
GFR, Estimated: >60 mL/min (ref >60)
Glucose, Bld: 86 mg/dL (ref 70–99)
Potassium: 3.8 mmol/L (ref 3.5–5.1)
Sodium: 140 mmol/L (ref 135–145)
Total Bilirubin: 0.7 mg/dL (ref 0.0–1.2)
Total Protein: 7.2 g/dL (ref 6.5–8.1)

## 2023-04-01 LAB — TROPONIN I (HIGH SENSITIVITY)
Troponin I (High Sensitivity): 3 ng/L (ref <18)
Troponin I (High Sensitivity): 3 ng/L (ref <18)

## 2023-04-01 LAB — LIPASE, BLOOD: Lipase: 26 U/L (ref 11–51)

## 2023-04-01 MED ORDER — IOHEXOL 300 MG/ML  SOLN
75.0000 mL | Freq: Once | INTRAMUSCULAR | Status: AC | PRN
  Administered 2023-04-01: 75 mL via INTRAVENOUS

## 2023-04-01 NOTE — ED Triage Notes (Signed)
 Pt comes with nausea and RUQ pain for few days. Pt denies any known fevers. Pt denies any vomiting. Pt states last night she had some right arm pain and numbness there.

## 2023-04-01 NOTE — ED Notes (Signed)
 Attempted for blood twice and was unsuccessful. Lab called to send phlebotomist at this time.

## 2023-04-01 NOTE — ED Provider Notes (Signed)
 [facility] Provider Note    Event Date/Time   First MD Initiated Contact with Patient 04/01/23 [phone]     (approximate)   History   Abdominal Pain   HPI  Grace [name] is a 58 y.o. female history of cholecystectomy for cholelithiasis presents to the ER for evaluation of right upper quadrant abdominal pain some right flank pain as well.  Symptoms have been present for the past 3 days gradually worsening.  Feels similar to when she had issues with her gallbladder in the past.  Also had an episode of discomfort in her right arm and shoulder last night that resolved.     Physical Exam   Triage Vital Signs: ED Triage Vitals  Encounter Vitals Group     BP 04/01/23 0802 (!) 115/96     Systolic BP Percentile --      Diastolic BP Percentile --      Pulse Rate 04/01/23 0802 77     Resp 04/01/23 0802 18     Temp 04/01/23 0802 98.3 F (36.8 C)     Temp src --      SpO2 04/01/23 0802 99 %     Weight 04/01/23 0801 200 lb (90.7 kg)     Height 04/01/23 0801 5\' 2"  (1.575 m)     Head Circumference --      Peak Flow --      Pain Score 04/01/23 0801 4     Pain Loc --      Pain Education --      Exclude from Growth Chart --     Most recent vital signs: Vitals:   04/01/23 0812 04/01/23 1154  BP:  (!) 127/59  Pulse:  64  Resp:  16  Temp:  98.6 F (37 C)  SpO2: 100% 99%     Constitutional: Alert  Eyes: Conjunctivae are normal.  Head: Atraumatic. Nose: No congestion/rhinnorhea. Mouth/Throat: Mucous membranes are moist.   Neck: Painless ROM.  Cardiovascular:   Good peripheral circulation. Respiratory: Normal respiratory effort.  No retractions.  Gastrointestinal: Soft with mild right sided tenderness to palpation.  No guarding or rebound. Musculoskeletal:  no deformity Neurologic:  MAE spontaneously. No gross focal neurologic deficits are appreciated.  Skin:  Skin is warm, dry and intact. No rash noted. Psychiatric: Mood and affect are normal.  Speech and behavior are normal.    ED Results / Procedures / Treatments   Labs (all labs ordered are listed, but only abnormal results are displayed) Labs Reviewed  URINALYSIS, ROUTINE W REFLEX MICROSCOPIC - Abnormal; Notable for the following components:      Result Value   Color, Urine YELLOW (*)    APPearance HAZY (*)    Ketones, ur 5 (*)    Leukocytes,Ua TRACE (*)    All other components within normal limits  COMPREHENSIVE METABOLIC PANEL - Abnormal; Notable for the following components:   Calcium 8.7 (*)    All other components within normal limits  CBC  LIPASE, BLOOD  TROPONIN I (HIGH SENSITIVITY)  TROPONIN I (HIGH SENSITIVITY)     EKG  ED ECG REPORT I, Willy Eddy, the attending physician, personally viewed and interpreted this ECG.   Date: 04/01/2023  EKG Time: 8:23  Rate: 65  Rhythm: sinus  Axis: normal  Intervals: normal  ST&T Change:  no stemi    RADIOLOGY Please see ED Course for my review and interpretation.  I personally reviewed all radiographic images ordered to evaluate for the above  acute complaints and reviewed radiology reports and findings.  These findings were personally discussed with the patient.  Please see medical record for radiology report.    PROCEDURES:  Critical Care performed:   Procedures   MEDICATIONS ORDERED IN ED: Medications  iohexol (OMNIPAQUE) 300 MG/ML solution 75 mL (75 mLs Intravenous Contrast Given 04/01/23 1151)     IMPRESSION / MDM / ASSESSMENT AND PLAN / ED COURSE  I reviewed the triage vital signs and the nursing notes.                              Differential diagnosis includes, but is not limited to, gastritis, colitis, obstruction, appendicitis, stone, musculoskeletal strain, reflux, ACS  Patient presenting to the ER for evaluation of symptoms as described above.  Based on symptoms, risk factors and considered above differential, this presenting complaint could reflect a potentially  life-threatening illness therefore the patient will be placed on continuous pulse oximetry and telemetry for monitoring.  Laboratory evaluation will be sent to evaluate for the above complaints.      Clinical Course as of 04/01/23 1241  Sun Apr 01, 2023  1150 CT imaging of my review and interpretation without evidence obstruction, perforation or acute intra-abdominal process.  Will await formal radiology report. [PR]  1239 Patient reassessed.  Repeat abdominal exam is benign.  Her workup is reassuring.  She does appear stable and appropriate for outpatient follow-up. [PR]    Clinical Course User Index [PR] Willy Eddy, MD     FINAL CLINICAL IMPRESSION(S) / ED DIAGNOSES   Final diagnoses:  Right upper quadrant abdominal pain     Rx / DC Orders   ED Discharge Orders     None        Note:  This document was prepared using Dragon voice recognition software and may include unintentional dictation errors.    Willy Eddy, MD 04/01/23 [phone]

## 2023-04-03 ENCOUNTER — Telehealth: Payer: Self-pay

## 2023-04-03 NOTE — Telephone Encounter (Signed)
 Informed patient there was nothing available sooner but I have added her to the wait list and we will let her know if anything comes up sooner.

## 2023-04-03 NOTE — Telephone Encounter (Signed)
 Patient called office not for a colonoscopy. She called to asked for a sooner appointment. Patient is currently schedule to see Dr Allegra Lai on 05/15/2023.

## 2023-04-03 NOTE — Telephone Encounter (Signed)
 Pt requesting call back to schedule colonoscopy.

## 2023-04-04 NOTE — Telephone Encounter (Signed)
 Okay

## 2023-04-16 ENCOUNTER — Other Ambulatory Visit: Payer: Self-pay

## 2023-04-17 ENCOUNTER — Encounter: Payer: Self-pay | Admitting: Gastroenterology

## 2023-04-17 ENCOUNTER — Ambulatory Visit (INDEPENDENT_AMBULATORY_CARE_PROVIDER_SITE_OTHER): Admitting: Gastroenterology

## 2023-04-17 VITALS — BP 108/55 | HR 67 | Temp 97.6°F | Ht 61.75 in | Wt 219.5 lb

## 2023-04-17 DIAGNOSIS — K760 Fatty (change of) liver, not elsewhere classified: Secondary | ICD-10-CM | POA: Diagnosis not present

## 2023-04-17 DIAGNOSIS — R1013 Epigastric pain: Secondary | ICD-10-CM | POA: Diagnosis not present

## 2023-04-17 DIAGNOSIS — R1011 Right upper quadrant pain: Secondary | ICD-10-CM | POA: Diagnosis not present

## 2023-04-17 NOTE — Progress Notes (Signed)
 Grace Repress, MD [address]  [address]  [place], [place] [address]  Main: [phone]  Fax: [phone]    Gastroenterology Consultation  Referring Provider:     No ref. provider found Primary Care Physician:  Barbette Reichmann, MD Primary Gastroenterologist:  Dr. Arlyss [name] Reason for Consultation: Right upper quadrant discomfort        HPI:   Grace [name] is a 58 y.o. female referred by Dr. Barbette Reichmann, MD  for consultation & management of recent exacerbation of chronic GERD symptoms.  Patient reports that for last 1 year, she has been having increasing symptoms of reflux, frequent burping, substernal chest pain associated with severe abdominal bloating, postprandial urgency and loose stools.  Patient was on Ozempic which led to 15 pound weight loss last year.  Since she does not have diabetes, it is no longer covered by her insurance for weight loss.  Patient reports that she has been following strict 1200-calorie diet for the last 1 year, she does have rice and potato for dinner. However, she is not seeing any loss of weight and she appears to be frustrated.  She does walk about 30 minutes daily.  She had menopause at age 89 and is on estrogen replacement.  Her TSH is normal. Labs are unremarkable  Patient also reports that she is diagnosed with several food allergies as well as she has been experiencing shortness of breath.  She was told that her shortness of breath is secondary to asthma attacks from uncontrolled reflux  Follow-up visit 09/13/2022 Grace [name] is a 58 year old female is here for follow-up of postprandial diarrhea.  She recently had an episode of right upper quadrant pain associated with nausea that lasted for about a week, went to [facility] ER on 08/17/2022, labs including serum lipase, LFTs, CBC were unrevealing, right upper quadrant ultrasound revealed fatty liver only.  She is no longer having right upper quadrant discomfort.  She had a  cholecystectomy about 2 years ago, since then, she has been experiencing watery bowel movement after breakfast.  She denies any further bowel movements rest of the day.  She leads a sedentary lifestyle, works from home from 7:30 AM to 5 PM, has been noticing that her weight has been trending up.  Lately, she has been tracking her calories and macros, noticed that her fat intake is more than her protein intake.  She also takes omeprazole 20 mg daily to keep her GERD symptoms under control.  Otherwise, she does not have any GI concerns today  Follow-up visit 04/17/2023 Grace [name] is here for a follow-up appointment to discuss about recent episode of right upper quadrant discomfort that started earlier this month, which was mild discomfort to begin, gradually progressed and she had to go to emergency room, underwent CT abdomen and pelvis which was unrevealing except for fatty liver, she is s/p cholecystectomy.  Labs including CBC, CMP, serum lipase.  She is trying to eat healthy, does consume red meat 2 to 3 days a week, diet Coke daily, leads a sedentary lifestyle, works from home.  And, consumes rice out of proportion.  Reports having regular bowel movements, does report feeling bloated.  Last Sunday, she finally felt better.  She cooks at home mostly.  She denies any burning in the chest or burning in the stomach.  This does have mild epigastric discomfort.  She also states she cannot tolerate eggs and dairy.  NSAIDs: None  Antiplts/Anticoagulants/Anti thrombotics: None  GI Procedures:  EGD 05/16/17 - Normal duodenal bulb and second portion of the duodenum. - Normal stomach. Biopsied. - Normal gastroesophageal junction and esophagus.   Colonoscopy 05/16/2017 - One 5 mm polyp in the rectum, removed with a cold snare. Resected and retrieved. - The entire examined colon is normal. - The distal rectum and anal verge are normal on retroflexion view. DIAGNOSIS:  A.  STOMACH; COLD BIOPSY:  - UNREMARKABLE  ANTRAL MUCOSA.  - OXYNTIC MUCOSA WITH CHANGES CONSISTENT WITH PROTON PUMP INHIBITOR USE.  - NEGATIVE FOR H. PYLORI, DYSPLASIA, AND MALIGNANCY.   B.  RECTUM POLYP; COLD SNARE:  - HYPERPLASTIC POLYP.  - NEGATIVE FOR DYSPLASIA AND MALIGNANCY  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   Qualifier: Diagnosis of  By: Lindwood Qua CMA, Jerl Santos     Allergic rhinitis    Allergy    Asthma    Biliary colic    Bronchitis    Cardiomyopathy, peripartum, postpartum condition    THIS RESOLVED 6 MONTHS AFTER DELIVERY   Diabetes mellitus without complication (HCC)    GERD (gastroesophageal reflux disease) 05/07/2012   Heart valve regurgitation    Hx of dysplastic nevus 2019   multiple sites   HYPOGLYCEMIA 11/08/2007   Qualifier: Diagnosis of  By: Lindwood Qua CMA, Jerl Santos     Other chest pain 09/14/2009   Qualifier: Diagnosis of  By: Mariah Milling MD, Tim      Past Surgical History:  Procedure Laterality Date   caesarean section     CESAREAN SECTION  12-05-2002   CHOLECYSTECTOMY N/A 05/21/2017   Procedure: LAPAROSCOPIC CHOLECYSTECTOMY;  Surgeon: Lattie Haw, MD;  Location: [facility] ORS;  Service: General;  Laterality: N/A;   COLONOSCOPY WITH PROPOFOL N/A 05/16/2017   Procedure: COLONOSCOPY WITH PROPOFOL;  Surgeon: Toney Reil, MD;  Location: [facility] ENDOSCOPY;  Service: Gastroenterology;  Laterality: N/A;   ESOPHAGOGASTRODUODENOSCOPY (EGD) WITH PROPOFOL N/A 05/16/2017   Procedure: ESOPHAGOGASTRODUODENOSCOPY (EGD) WITH PROPOFOL;  Surgeon: Toney Reil, MD;  Location[facility] ENDOSCOPY;  Service: Gastroenterology;  Laterality: N/A;   htn     pulmonary edema with childbith, had cardiac w/ up   SPIROMETRY  12/2005   at allergist's- normal   WISDOM TOOTH EXTRACTION     Current Outpatient Medications:    Albuterol-Budesonide (AIRSUPRA) 90-80 MCG/ACT AERO, Inhale 2 puffs into the lungs every 4 (four) hours as needed. Not to exceed 12 inhalations in 24 hours, Disp: 5.9  g, Rfl: 3   B Complex Vitamins (VITAMIN B COMPLEX) TABS, See admin instructions., Disp: , Rfl:    budesonide-formoterol (SYMBICORT) 80-4.5 MCG/ACT inhaler, 2 puffs as needed., Disp: , Rfl:    ciclopirox (LOPROX) 0.77 % cream, Apply topically 2 (two) times daily., Disp: , Rfl:    EPINEPHrine 0.3 mg/0.3 mL IJ SOAJ injection, Inject 0.3 mg into the muscle once as needed., Disp: 3 each, Rfl: 0   hydrocortisone 2.5 % cream, Apply topically to itchy rash at left breast twice daily for up to 2 weeks prn. Avoid face, groin, axilla. Topical steroids can cause thinning and lightening of the skin if they are used for too long in the same area., Disp: 30 g, Rfl: 11   mometasone (NASONEX) 50 MCG/ACT nasal spray, Place into the nose., Disp: , Rfl:    Multiple Vitamins-Minerals (MULTIVITAL) tablet, Take 1 tablet by mouth daily.  , Disp: , Rfl:    mupirocin ointment (BACTROBAN) 2 %, Apply to aa TID PRN., Disp: 22 g, Rfl: 0   Vitamin D,  Ergocalciferol, (DRISDOL) 50000 UNITS CAPS, Take 50,000 Units by mouth every 7 (seven) days., Disp: , Rfl:     Family History  Problem Relation Age of Onset   Heart disease Mother    Diabetes Mother    Hyperlipidemia Mother    Hypertension Mother    Heart attack Mother    Arthritis Father    Asthma Father    COPD Father    Cancer Father        lung   Diabetes Father    Hypertension Father    Hyperlipidemia Father    Breast cancer Maternal Aunt 30   Breast cancer Maternal Grandmother 24     Social History   Tobacco Use   Smoking status: Never   Smokeless tobacco: Never  Vaping Use   Vaping status: Never Used  Substance Use Topics   Alcohol use: Not Currently    Comment: occas   Drug use: No    Allergies as of 04/17/2023 - Review Complete 04/17/2023  Allergen Reaction Noted   Antihistamines, diphenhydramine-type  08/18/2020   Aspirin Other (See Comments) 09/10/2017   Cephalexin     Cetirizine Hives and Other (See Comments) 03/12/2012   Dramamine  [dimenhydrinate] Other (See Comments) 09/17/2020   Egg-derived products Itching 01/30/2014   Gluten meal Swelling 01/30/2014   Meclizine Other (See Comments) 10/19/2020   Penicillins     Tilactase Itching 01/30/2014    Review of Systems:    All systems reviewed and negative except where noted in HPI.   Physical Exam:  BP (!) 108/55 (BP Location: Left Arm, Patient Position: Sitting, Cuff Size: Large)   Pulse 67   Temp 97.6 F (36.4 C) (Oral)   Ht 5' 1.75" (1.568 m)   Wt 219 lb 8 oz (99.6 kg)   LMP 05/18/2015 (Approximate)   BMI 40.47 kg/m  Patient's last menstrual period was 05/18/2015 (approximate).  General:   Alert,  Well-developed, well-nourished, pleasant and cooperative in NAD Head:  Normocephalic and atraumatic. Eyes:  Sclera clear, no icterus.   Conjunctiva pink. Ears:  Normal auditory acuity. Nose:  No deformity, discharge, or lesions. Mouth:  No deformity or lesions,oropharynx pink & moist. Neck:  Supple; no masses or thyromegaly. Lungs:  Respirations even and unlabored.  Clear throughout to auscultation.   No wheezes, crackles, or rhonchi. No acute distress. Heart:  Regular rate and rhythm; no murmurs, clicks, rubs, or gallops. Abdomen:  Normal bowel sounds. Soft, non-tender and non distended without masses, hepatosplenomegaly or hernias noted.  No guarding or rebound tenderness.   Rectal: Not performed Msk:  Symmetrical without gross deformities. Good, equal movement & strength bilaterally. Pulses:  Normal pulses noted. Extremities:  No clubbing or edema.  No cyanosis. Neurologic:  Alert and oriented x3;  grossly normal neurologically. Skin:  Intact without significant lesions or rashes. No jaundice. Psych:  Alert and cooperative. Normal mood and affect.  Imaging Studies: Reviewed  Assessment and Plan:   SUHAILA TROIANO is a 58 y.o. pleasant Caucasian female with morbid obesity is seen for follow-up of 3 weeks history of right upper quadrant and epigastric  discomfort, abdominal bloating.  History of cholecystectomy.  CT abdomen pelvis with contrast and labs including CBC, CMP and serum lipase were unrevealing.  History of fatty liver  Right upper quadrant/epigastric discomfort with abdominal bloating Check food allergy profile, alpha gal panel EGD was negative in the past, no evidence of H. pylori, celiac disease panel negative CT abdomen/pelvis did not reveal any acute intra-abdominal pathology  Advised patient regarding following healthy lifestyle, start physical activity, we talked about having a goal of 5k to 10k steps daily Avoid consuming red meat, diet coke, very limited amount of of rice  Fatty liver: Normal LFTs Discussed about lifestyle modification as above   Fibrosis 4 Score = 1.06 (Low risk)        Interpretation for patients with NAFLD          <1.30       -  F0-F1 (Low risk)          1.30-2.67 -  Indeterminate           >2.67      -  F3-F4 (High risk)     Validated for ages 56-65          Recommend to repeat colonoscopy in 10 years for colon cancer screening   Follow up as needed   Grace Repress, MD

## 2023-04-24 ENCOUNTER — Encounter: Payer: Self-pay | Admitting: Gastroenterology

## 2023-04-24 LAB — FOOD ALLERGY PROFILE
Allergen Corn, IgE: <0.1 kU/L
Clam IgE: <0.1 kU/L
Codfish IgE: 0.11 kU/L — AB
Egg White IgE: <0.1 kU/L
Milk IgE: <0.1 kU/L
Peanut IgE: <0.1 kU/L
Scallop IgE: <0.1 kU/L
Sesame Seed IgE: <0.1 kU/L
Shrimp IgE: 0.15 kU/L — AB
Soybean IgE: <0.1 kU/L
Walnut IgE: <0.1 kU/L
Wheat IgE: <0.1 kU/L

## 2023-04-24 LAB — ALPHA-GAL PANEL
Allergen Lamb IgE: <0.1 kU/L
Beef IgE: <0.1 kU/L
IgE (Immunoglobulin E), Serum: 201 [IU]/mL (ref 6–495)
O215-IgE Alpha-Gal: <0.1 kU/L
Pork IgE: <0.1 kU/L

## 2023-05-15 ENCOUNTER — Ambulatory Visit: Admitting: Gastroenterology

## 2023-06-22 DIAGNOSIS — M542 Cervicalgia: Secondary | ICD-10-CM | POA: Diagnosis not present

## 2023-06-22 DIAGNOSIS — M62838 Other muscle spasm: Secondary | ICD-10-CM | POA: Diagnosis not present

## 2023-06-22 DIAGNOSIS — M5412 Radiculopathy, cervical region: Secondary | ICD-10-CM | POA: Diagnosis not present

## 2023-07-24 DIAGNOSIS — R87619 Unspecified abnormal cytological findings in specimens from cervix uteri: Secondary | ICD-10-CM | POA: Diagnosis not present

## 2023-07-24 DIAGNOSIS — Z124 Encounter for screening for malignant neoplasm of cervix: Secondary | ICD-10-CM | POA: Diagnosis not present

## 2023-08-28 ENCOUNTER — Ambulatory Visit: Payer: Self-pay | Admitting: Internal Medicine

## 2023-08-29 DIAGNOSIS — Z6841 Body Mass Index (BMI) 40.0 and over, adult: Secondary | ICD-10-CM | POA: Diagnosis not present

## 2023-08-29 DIAGNOSIS — Z713 Dietary counseling and surveillance: Secondary | ICD-10-CM | POA: Diagnosis not present

## 2023-10-08 ENCOUNTER — Ambulatory Visit: Payer: BC Managed Care – PPO | Admitting: Dermatology

## 2023-10-15 DIAGNOSIS — R400 Somnolence: Secondary | ICD-10-CM | POA: Diagnosis not present

## 2023-10-15 DIAGNOSIS — K219 Gastro-esophageal reflux disease without esophagitis: Secondary | ICD-10-CM | POA: Diagnosis not present

## 2023-10-15 DIAGNOSIS — Z91018 Allergy to other foods: Secondary | ICD-10-CM | POA: Diagnosis not present

## 2023-10-23 DIAGNOSIS — Z6841 Body Mass Index (BMI) 40.0 and over, adult: Secondary | ICD-10-CM | POA: Diagnosis not present

## 2023-10-23 DIAGNOSIS — Z713 Dietary counseling and surveillance: Secondary | ICD-10-CM | POA: Diagnosis not present
